# Patient Record
Sex: Female | Born: 1965 | Race: White | Hispanic: No | Marital: Married | Smoking: Never smoker
Health system: Southern US, Community
[De-identification: ages and names within clinical notes are randomized; demographics above are authoritative.]

## PROBLEM LIST (undated history)

## (undated) DIAGNOSIS — Z973 Presence of spectacles and contact lenses: Secondary | ICD-10-CM

## (undated) DIAGNOSIS — S83206A Unspecified tear of unspecified meniscus, current injury, right knee, initial encounter: Secondary | ICD-10-CM

## (undated) DIAGNOSIS — Z86018 Personal history of other benign neoplasm: Secondary | ICD-10-CM

## (undated) DIAGNOSIS — J309 Allergic rhinitis, unspecified: Secondary | ICD-10-CM

## (undated) DIAGNOSIS — M1990 Unspecified osteoarthritis, unspecified site: Secondary | ICD-10-CM

## (undated) DIAGNOSIS — M9420 Chondromalacia, unspecified site: Secondary | ICD-10-CM

## (undated) DIAGNOSIS — I8390 Asymptomatic varicose veins of unspecified lower extremity: Secondary | ICD-10-CM

## (undated) DIAGNOSIS — G43909 Migraine, unspecified, not intractable, without status migrainosus: Secondary | ICD-10-CM

---

## 2000-08-25 HISTORY — PX: VAGINAL HYSTERECTOMY: SUR661

## 2001-11-15 ENCOUNTER — Other Ambulatory Visit: Admission: RE | Admit: 2001-11-15 | Discharge: 2001-11-15 | Payer: Self-pay | Admitting: Obstetrics and Gynecology

## 2002-01-03 ENCOUNTER — Encounter: Payer: Self-pay | Admitting: Obstetrics and Gynecology

## 2002-01-03 ENCOUNTER — Ambulatory Visit (HOSPITAL_COMMUNITY): Admission: RE | Admit: 2002-01-03 | Discharge: 2002-01-03 | Payer: Self-pay | Admitting: Obstetrics and Gynecology

## 2002-01-24 ENCOUNTER — Ambulatory Visit (HOSPITAL_COMMUNITY): Admission: RE | Admit: 2002-01-24 | Discharge: 2002-01-24 | Payer: Self-pay | Admitting: Obstetrics and Gynecology

## 2002-07-09 ENCOUNTER — Inpatient Hospital Stay (HOSPITAL_COMMUNITY): Admission: AD | Admit: 2002-07-09 | Discharge: 2002-07-11 | Payer: Self-pay | Admitting: Obstetrics and Gynecology

## 2002-07-29 ENCOUNTER — Encounter: Admission: RE | Admit: 2002-07-29 | Discharge: 2002-07-29 | Payer: Self-pay | Admitting: Obstetrics and Gynecology

## 2002-07-29 ENCOUNTER — Encounter: Payer: Self-pay | Admitting: Obstetrics and Gynecology

## 2003-01-26 ENCOUNTER — Encounter: Admission: RE | Admit: 2003-01-26 | Discharge: 2003-01-26 | Payer: Self-pay | Admitting: Obstetrics and Gynecology

## 2003-01-26 ENCOUNTER — Encounter: Payer: Self-pay | Admitting: Obstetrics and Gynecology

## 2003-03-07 ENCOUNTER — Other Ambulatory Visit: Admission: RE | Admit: 2003-03-07 | Discharge: 2003-03-07 | Payer: Self-pay | Admitting: Obstetrics and Gynecology

## 2003-07-31 ENCOUNTER — Encounter: Admission: RE | Admit: 2003-07-31 | Discharge: 2003-07-31 | Payer: Self-pay | Admitting: General Surgery

## 2004-04-09 ENCOUNTER — Other Ambulatory Visit: Admission: RE | Admit: 2004-04-09 | Discharge: 2004-04-09 | Payer: Self-pay | Admitting: Obstetrics and Gynecology

## 2005-09-08 ENCOUNTER — Encounter: Admission: RE | Admit: 2005-09-08 | Discharge: 2005-09-08 | Payer: Self-pay | Admitting: Obstetrics and Gynecology

## 2006-02-17 ENCOUNTER — Other Ambulatory Visit: Admission: RE | Admit: 2006-02-17 | Discharge: 2006-02-17 | Payer: Self-pay | Admitting: Obstetrics and Gynecology

## 2006-08-25 LAB — HM MAMMOGRAPHY

## 2006-08-25 LAB — HM PAP SMEAR

## 2012-03-25 ENCOUNTER — Other Ambulatory Visit (HOSPITAL_BASED_OUTPATIENT_CLINIC_OR_DEPARTMENT_OTHER): Payer: Self-pay | Admitting: Family Medicine

## 2012-03-25 ENCOUNTER — Ambulatory Visit (HOSPITAL_BASED_OUTPATIENT_CLINIC_OR_DEPARTMENT_OTHER)
Admission: RE | Admit: 2012-03-25 | Discharge: 2012-03-25 | Disposition: A | Discharge status: Home / Self Care | Payer: 59 | Source: Ambulatory Visit | Attending: Family Medicine | Admitting: Family Medicine

## 2012-03-25 DIAGNOSIS — Z1231 Encounter for screening mammogram for malignant neoplasm of breast: Secondary | ICD-10-CM

## 2012-03-30 ENCOUNTER — Other Ambulatory Visit: Payer: Self-pay | Admitting: Family Medicine

## 2012-03-30 DIAGNOSIS — R928 Other abnormal and inconclusive findings on diagnostic imaging of breast: Secondary | ICD-10-CM

## 2012-04-12 ENCOUNTER — Ambulatory Visit
Admission: RE | Admit: 2012-04-12 | Discharge: 2012-04-12 | Disposition: A | Discharge status: Home / Self Care | Payer: 59 | Source: Ambulatory Visit | Attending: Family Medicine | Admitting: Family Medicine

## 2012-04-12 DIAGNOSIS — R928 Other abnormal and inconclusive findings on diagnostic imaging of breast: Secondary | ICD-10-CM

## 2012-12-09 ENCOUNTER — Encounter: Payer: Self-pay | Admitting: *Deleted

## 2012-12-09 DIAGNOSIS — E669 Obesity, unspecified: Secondary | ICD-10-CM

## 2012-12-09 DIAGNOSIS — Z889 Allergy status to unspecified drugs, medicaments and biological substances status: Secondary | ICD-10-CM

## 2013-01-04 ENCOUNTER — Encounter: Payer: Self-pay | Admitting: Family Medicine

## 2013-01-04 ENCOUNTER — Encounter: Payer: Self-pay | Admitting: *Deleted

## 2013-01-04 ENCOUNTER — Ambulatory Visit (INDEPENDENT_AMBULATORY_CARE_PROVIDER_SITE_OTHER): Payer: 59 | Admitting: Family Medicine

## 2013-01-04 VITALS — BP 130/85 | HR 75 | Ht 62.0 in | Wt 161.0 lb

## 2013-01-04 DIAGNOSIS — E669 Obesity, unspecified: Secondary | ICD-10-CM

## 2013-01-04 DIAGNOSIS — I83811 Varicose veins of right lower extremities with pain: Secondary | ICD-10-CM

## 2013-01-04 DIAGNOSIS — I83893 Varicose veins of bilateral lower extremities with other complications: Secondary | ICD-10-CM

## 2013-01-04 DIAGNOSIS — G43909 Migraine, unspecified, not intractable, without status migrainosus: Secondary | ICD-10-CM

## 2013-01-04 MED ORDER — PHENTERMINE HCL 37.5 MG PO TABS: 37.5000 mg | ORAL_TABLET | Freq: Every day | ORAL | Status: DC

## 2013-01-04 MED ORDER — CYCLOBENZAPRINE HCL 5 MG PO TABS: ORAL_TABLET | ORAL | Status: DC

## 2013-01-04 MED ORDER — RIZATRIPTAN BENZOATE 10 MG PO TBDP: 10.0000 mg | ORAL_TABLET | ORAL | Status: AC | PRN

## 2013-01-04 MED ORDER — DICLOFENAC SODIUM 1 % TD GEL: TRANSDERMAL | Status: DC

## 2013-01-04 MED ORDER — TOPIRAMATE ER 50 MG PO CAP24: 1.0000 | ORAL_CAPSULE | Freq: Every day | ORAL | Status: DC

## 2013-01-04 MED ORDER — PROMETHAZINE HCL 12.5 MG PO TABS: ORAL_TABLET | ORAL | Status: DC

## 2013-01-04 NOTE — Progress Notes (Signed)
  Subjective:    Patient ID: Lauren Wise, female    DOB: 11/30/1965, 47 y.o.   MRN: [id number]  HPI Lauren Wise is here today to discuss a few issues.  1)  Varicose Veins:  She has varicose veins and wants to know the best options for treatment.    2)  Migraine Headaches:  She has never taken medications specifically for migraine headaches.    3)  Weight:  She has maintained her weight loss.     Review of Systems  Constitutional: Negative for unexpected weight change.  Musculoskeletal:       Pain in legs R > L   Neurological: Positive for headaches.   Past Medical History  Diagnosis Date  . Allergy   . Obesity   . Wheezing without diagnosis of asthma   . Varicose veins of right lower extremity with pain    Family History  Problem Relation Age of Onset  . Diabetes Mother   . Heart disease Maternal Grandmother   . Heart disease Paternal Grandfather   . Stroke Paternal Grandfather    History   Social History Narrative   Marital Status: Married [employer])    Children:  Daughter Lupita Leash) Son Reuel Boom)    Pets: Dog (1)    Living Situation: Lives with her husband and daughter.   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 No distress.  HENT:  Head: Normocephalic.  Eyes: No scleral icterus.  Neck: No thyromegaly present.  Cardiovascular: Normal rate, regular rhythm and normal heart sounds.   Pulmonary/Chest: Effort normal and breath sounds normal.  Abdominal: There is no tenderness.  Musculoskeletal: She exhibits no edema and no tenderness.  Neurological: She is alert.  Skin: Skin is warm and dry.  Psychiatric: She has a normal mood and affect. Her behavior is normal. Judgment  and thought content normal.          Assessment & Plan:

## 2013-01-04 NOTE — Patient Instructions (Addendum)
1)  Varicose Veins - Apply the Voltaren Gel up to 4 times per day and consider wearing compression stockings/ support hose to help with the pain.  Consider making an appointment with a vein specialist to see what your options might be.    2)  Weight - Continue on the phentermine taking 1/2 pill per day.    3)  Migraine Headaches - Start on the Trokendi XR 25 mg then increase to 50 mg. We can increase to 100 mg if needed.  If you feel groggy/loopy you can try taking some L-Tyrosine to decrease those symptoms.     Varicose Veins Varicose veins are veins that have become enlarged and twisted. CAUSES This condition is the result of valves in the veins not working properly. Valves in the veins help return blood from the leg to the heart. If these valves are damaged, blood flows backwards and backs up into the veins in the leg near the skin. This causes the veins to become larger. People who are on their feet a lot, who are pregnant, or who are overweight are more likely to develop varicose veins. SYMPTOMS   Bulging, twisted-appearing, bluish veins, most commonly found on the legs.  Leg pain or a feeling of heaviness. These symptoms may be worse at the end of the day.  Leg swelling.  Skin color changes. DIAGNOSIS  Varicose veins can usually be diagnosed with an exam of your legs by your caregiver. He or she may recommend an ultrasound of your leg veins. TREATMENT  Most varicose veins can be treated at home.However, other treatments are available for people who have persistent symptoms or who want to treat the cosmetic appearance of the varicose veins. These include:  Laser treatment of very small varicose veins.  Medicine that is shot (injected) into the vein. This medicine hardens the walls of the vein and closes off the vein. This treatment is called sclerotherapy. Afterwards, you may need to wear clothing or bandages that apply pressure.  Surgery. HOME CARE INSTRUCTIONS   Do not stand  or sit in one position for long periods of time. Do not sit with your legs crossed. Rest with your legs raised during the day.  Wear elastic stockings or support hose. Do not wear other tight, encircling garments around the legs, pelvis, or waist.  Walk as much as possible to increase blood flow.  Raise the foot of your bed at night with 2-inch blocks.  If you get a cut in the skin over the vein and the vein bleeds, lie down with your leg raised and press on it with a clean cloth until the bleeding stops. Then place a bandage (dressing) on the cut. See your caregiver if it continues to bleed or needs stitches. SEEK MEDICAL CARE IF:   The skin around your ankle starts to break down.  You have pain, redness, tenderness, or hard swelling developing in your leg over a vein.  You are uncomfortable due to leg pain. Document Released: 05/21/2005 Document Revised: 11/03/2011 Document Reviewed: 10/07/2010 [facility] Patient Information 2013 [place], [place].  Varicose Vein Surgery Varicose veins are veins that have become enlarged and twisted. Small varicose veins are sometimes called "spider veins." Valves in the veins help move blood from the leg to the heart. If these valves are damaged, blood flows backwards. The blood backs up into the veins in the leg near the skin surface. The veins expand and get larger because of increased pressure against the inside walls of the  veins. People who are on their feet for long periods are at higher risk for this problem. It is also commonly seen during pregnancy or in those who are overweight. Varicose vein surgery is done to remove enlarged veins. This helps reduce pain, aching, and the risk of bleeding and blood clots that can form in these veins. Surgery can also improve the cosmetic appearance of the affected area. There are several surgical methods that can be used. Your caregiver will discuss the method that is best for you based on your specific needs.  Treatment usually does not mean a hospital stay or a long, uncomfortable recovery. Less invasive techniques most often allow varicose veins to be dealt with on an outpatient basis. LET YOUR CAREGIVER KNOW ABOUT:   Allergies.  Medications taken including herbs, eye drops, prescription medications, over the counter medications, and creams.  Use of steroids (by mouth or creams).  Use of aspirin or blood-thinning medicines.  History of bleeding or blood problems.  Previous problems with anesthetics or Novocaine.  Possibility of pregnancy, if this applies.  History of blood clots (thrombophlebitis).  Previous surgery.  Other health problems. RISKS AND COMPLICATIONS  Blood clots in deep veins (thrombophlebitis).  Infection.  Ulcer of the skin (breakdown of skin).  Recurrent varicose veins.  If receiving Sclerotherapy (see below), the following are uncommon but possible:  Temporary stinging or painful cramps where the injection was made.  Temporary red, raised patches of skin where the injection was made.  Temporary small skin sores where the injection was made.  Temporary bruises where the injection was made.  Spots around the treated vein that usually disappear.  Brown lines around the treated vein that usually disappear.  Groups of fine, red blood vessels around the treated vein that usually disappear.  The treated vein can also become inflamed or develop lumps of clotted blood. This is not dangerous. BEFORE THE PROCEDURE  If you are using any "blood thinner" medicines or medicines for arthritis, they may need to be stopped temporarily before the procedure. Make sure you discuss this with your caregiver before surgery. PROCEDURE Several types of surgery are available for safe and effective treatment of varicose veins:   Sclerotherapy. Your caregiver injects small and medium sized varicose veins with a solution that scars and closes those veins. In a few weeks,  treated varicose veins should fade. Some veins may need to be injected more than once. Sclerotherapy is effective if done correctly. Sclerotherapy does not require anesthesia and can be done in your doctor's office.  Laser surgeries. Doctors use lasers on smaller varicose veins. Laser surgery works by sending strong bursts of light onto the vein, which makes the vein slowly fade and disappear. No incisions or needles are used. The whole procedure usually takes less than 45 minutes.  Catheter-assisted procedures. This procedure is usually done for larger varicose veins. In one of these treatments, your caregiver inserts a thin tube (catheter) into an enlarged vein and heats the tip of the catheter. As the catheter is pulled out, the heat destroys the vein by causing it to collapse and seal shut.  Vein stripping. This involves removing a long vein through small incisions. This is an outpatient procedure for most, but not all people. Removing the vein does not affect circulation in your leg because veins deeper in the leg take care of the larger volumes of blood.  Ambulatory phlebectomy. Your doctor removes smaller varicose veins through a series of tiny skin punctures. Local anesthesia is  used in this outpatient procedure. Scarring is generally minimal.  Endoscopic vein surgery. You might need this operation only in a severe case involving leg ulcers. Your caregiver uses a thin video camera and inserts it into your leg to close varicose veins. The doctor then removes the veins through small incisions. AFTER THE PROCEDURE   You may be asked to wear a compression stocking for 3 to 5 days. This will insure that the treated vein(s) stays closed.  If you were taking any "blood thinner" medicines or medicines for arthritis, talk to your caregiver about when these can be restarted. HOME CARE INSTRUCTIONS   You may need to elevate the treated leg for part of the day for the first 2-3 days after  treatment  You may need to continue using compression stockings to lower the chance of developing recurrent varicose veins.  Restart regular prescription and non-prescription medicines as per your caregiver's instructions. SEEK MEDICAL CARE IF:   An oral temperature above 102 F (38.9 C) develops, not controlled by medication.  You notice increasing pain not adequately controlled with medicine prescribed.  You notice pus or oozing of fluid from any incision site 2 or more days after the surgery. SEEK IMMEDIATE MEDICAL CARE IF:   There is increased pain or swelling in the calf of your leg.  You notice red-streaking starting at an incision site and extending above or below that site.  An oral temperature above 102 F (38.9 C) develops, not controlled by medication.  You have sudden onset of chest pain, shortness of breath, difficulty breathing, or begin coughing up blood.  You develop unexplained abdominal pain.  You have increased bruising or develop sudden swelling in a joint. Document Released: 08/31/2007 Document Revised: 11/03/2011 Document Reviewed: 08/31/2007 [facility] Patient Information 2013 [place], [place].

## 2013-01-17 ENCOUNTER — Encounter: Payer: Self-pay | Admitting: Family Medicine

## 2013-01-17 DIAGNOSIS — I83819 Varicose veins of unspecified lower extremities with pain: Secondary | ICD-10-CM | POA: Insufficient documentation

## 2013-01-17 DIAGNOSIS — G43909 Migraine, unspecified, not intractable, without status migrainosus: Secondary | ICD-10-CM | POA: Insufficient documentation

## 2013-01-17 NOTE — Assessment & Plan Note (Signed)
We discussed Qsymia but she will try a combination of phentermine and Topamax.

## 2013-01-17 NOTE — Assessment & Plan Note (Addendum)
She was given medications to help with her migraine headaches including an extended release form of Topamax (Trokendi XR), Maxalt, Phenergan and Flexeril.

## 2013-01-17 NOTE — Assessment & Plan Note (Signed)
Lauren Wise will follow up with [facility].

## 2013-03-01 ENCOUNTER — Ambulatory Visit: Payer: 59 | Admitting: Family Medicine

## 2013-03-14 ENCOUNTER — Telehealth: Payer: Self-pay | Admitting: *Deleted

## 2013-03-14 NOTE — Telephone Encounter (Signed)
Lauren Wise has an appt. In 03/2013. Can you refill her topiramate for 30 days?  I have not verified this med. She would like it to go to [facility] on [address]. [place]

## 2013-04-12 ENCOUNTER — Ambulatory Visit (INDEPENDENT_AMBULATORY_CARE_PROVIDER_SITE_OTHER): Payer: 59 | Admitting: Family Medicine

## 2013-04-12 ENCOUNTER — Encounter: Payer: Self-pay | Admitting: Family Medicine

## 2013-04-12 ENCOUNTER — Other Ambulatory Visit: Payer: Self-pay

## 2013-04-12 VITALS — BP 108/74 | HR 73 | Resp 16 | Wt 164.0 lb

## 2013-04-12 DIAGNOSIS — G43909 Migraine, unspecified, not intractable, without status migrainosus: Secondary | ICD-10-CM

## 2013-04-12 DIAGNOSIS — Z1231 Encounter for screening mammogram for malignant neoplasm of breast: Secondary | ICD-10-CM

## 2013-04-12 DIAGNOSIS — J309 Allergic rhinitis, unspecified: Secondary | ICD-10-CM

## 2013-04-12 MED ORDER — AZELASTINE HCL 0.05 % OP SOLN: 1.0000 [drp] | Freq: Two times a day (BID) | OPHTHALMIC | Status: AC

## 2013-04-12 MED ORDER — AZELASTINE HCL 0.1 % NA SOLN: 2.0000 | Freq: Two times a day (BID) | NASAL | Status: DC

## 2013-04-12 MED ORDER — MONTELUKAST SODIUM 10 MG PO TABS: 10.0000 mg | ORAL_TABLET | Freq: Every day | ORAL | Status: DC

## 2013-04-12 MED ORDER — AZELASTINE HCL 0.05 % OP SOLN: 1.0000 [drp] | Freq: Two times a day (BID) | OPHTHALMIC | Status: DC

## 2013-04-12 MED ORDER — TOPIRAMATE ER 100 MG PO CAP24: 100.0000 mg | ORAL_CAPSULE | Freq: Every day | ORAL | Status: DC

## 2013-04-12 NOTE — Patient Instructions (Addendum)
1)  Migraine Headaches -   Trokendi XR 100 mg  Migrelief  Chiropractic Adjustment and/or acupuncture   Dr. Neale Burly ([facility]) Botox   2)  Allergies  Zyrtec at night  Allegra D in morning Norel AD   Singulair - Stuffy   Astelin - Nasal sray you can do with Nasonex.  It is a nasal antihistamine spray.    Eye - Zaditor vs Optivar vs Patanol.     Recurrent Migraine Headache A migraine headache is an intense, throbbing pain on one or both sides of your head. Recurrent migraines keep coming back. A migraine can last for 30 minutes to several hours. CAUSES  The exact cause of a migraine headache is not always known. However, a migraine may be caused when nerves in the brain become irritated and release chemicals that cause inflammation. This causes pain.  SYMPTOMS   Pain on one or both sides of your head.  Pulsating or throbbing pain.  Severe pain that prevents daily activities.  Pain that is aggravated by any physical activity.  Nausea, vomiting, or both.  Dizziness.  Pain with exposure to bright lights, loud noises, or activity.  General sensitivity to bright lights, loud noises, or smells. Before you get a migraine, you may get warning signs that a migraine is coming (aura). An aura may include:  Seeing flashing lights.  Seeing bright spots, halos, or zig-zag lines.  Having tunnel vision or blurred vision.  Having feelings of numbness or tingling.  Having trouble talking.  Having muscle weakness. MIGRAINE TRIGGERS Examples of triggers of migraine headaches include:   Alcohol.  Smoking.  Stress.  Menstruation.  Aged cheeses.  Foods or drinks that contain nitrates, glutamate, aspartame, or tyramine.  Lack of sleep.  Chocolate.  Caffeine.  Hunger.  Physical exertion.  Fatigue.  Medicines used to treat chest pain (nitroglycerine), birth control pills, estrogen, and some blood pressure medicines. DIAGNOSIS  A recurrent  migraine headache is often diagnosed based on:  Symptoms.  Physical examination.  A CT scan or MRI of your head. TREATMENT  Medicines may be given for pain and nausea. Medicines can also be given to help prevent recurrent migraines. HOME CARE INSTRUCTIONS  Only take over-the-counter or prescription medicines for pain or discomfort as directed by your caregiver. The use of long-term narcotics is not recommended.  Lie down in a dark, quiet room when you have a migraine.  Keep a journal to find out what may trigger your migraine headaches. For example, write down:  What you eat and drink.  How much sleep you get.  Any change to your diet or medicines.  Limit alcohol consumption.  Quit smoking if you smoke.  Get 7 to 9 hours of sleep, or as recommended by your caregiver.  Limit stress.  Keep lights dim if bright lights bother you and make your migraines worse. SEEK MEDICAL CARE IF:   You do not get relief from the medicines given to you.  You have a recurrence of pain. SEEK IMMEDIATE MEDICAL CARE IF:  Your migraine becomes severe.  You have a fever.  You have a stiff neck.  You have loss of vision.  You have muscular weakness or loss of muscle control.  You start losing your balance or have trouble walking.  You feel faint or pass out.  You have severe symptoms that are different from your first symptoms. MAKE SURE YOU:   Understand these instructions.  Will watch your condition.  Will get help right away  if you are not doing well or get worse. Document Released: 05/06/2001 Document Revised: 11/03/2011 Document Reviewed: 08/01/2011 [facility] Patient Information 2014 [place], [place]. Recurrent Migraine Headache A migraine headache is an intense, throbbing pain on one or both sides of your head. Recurrent migraines keep coming back. A migraine can last for 30 minutes to several hours. CAUSES  The exact cause of a migraine headache is not always known.  However, a migraine may be caused when nerves in the brain become irritated and release chemicals that cause inflammation. This causes pain.  SYMPTOMS   Pain on one or both sides of your head.  Pulsating or throbbing pain.  Severe pain that prevents daily activities.  Pain that is aggravated by any physical activity.  Nausea, vomiting, or both.  Dizziness.  Pain with exposure to bright lights, loud noises, or activity.  General sensitivity to bright lights, loud noises, or smells. Before you get a migraine, you may get warning signs that a migraine is coming (aura). An aura may include:  Seeing flashing lights.  Seeing bright spots, halos, or zig-zag lines.  Having tunnel vision or blurred vision.  Having feelings of numbness or tingling.  Having trouble talking.  Having muscle weakness. MIGRAINE TRIGGERS Examples of triggers of migraine headaches include:   Alcohol.  Smoking.  Stress.  Menstruation.  Aged cheeses.  Foods or drinks that contain nitrates, glutamate, aspartame, or tyramine.  Lack of sleep.  Chocolate.  Caffeine.  Hunger.  Physical exertion.  Fatigue.  Medicines used to treat chest pain (nitroglycerine), birth control pills, estrogen, and some blood pressure medicines. DIAGNOSIS  A recurrent migraine headache is often diagnosed based on:  Symptoms.  Physical examination.  A CT scan or MRI of your head. TREATMENT  Medicines may be given for pain and nausea. Medicines can also be given to help prevent recurrent migraines. HOME CARE INSTRUCTIONS  Only take over-the-counter or prescription medicines for pain or discomfort as directed by your caregiver. The use of long-term narcotics is not recommended.  Lie down in a dark, quiet room when you have a migraine.  Keep a journal to find out what may trigger your migraine headaches. For example, write down:  What you eat and drink.  How much sleep you get.  Any change to your diet  or medicines.  Limit alcohol consumption.  Quit smoking if you smoke.  Get 7 to 9 hours of sleep, or as recommended by your caregiver.  Limit stress.  Keep lights dim if bright lights bother you and make your migraines worse. SEEK MEDICAL CARE IF:   You do not get relief from the medicines given to you.  You have a recurrence of pain. SEEK IMMEDIATE MEDICAL CARE IF:  Your migraine becomes severe.  You have a fever.  You have a stiff neck.  You have loss of vision.  You have muscular weakness or loss of muscle control.  You start losing your balance or have trouble walking.  You feel faint or pass out.  You have severe symptoms that are different from your first symptoms. MAKE SURE YOU:   Understand these instructions.  Will watch your condition.  Will get help right away if you are not doing well or get worse. Document Released: 05/06/2001 Document Revised: 11/03/2011 Document Reviewed: 08/01/2011 [facility] Patient Information 2014 [place], [place].

## 2013-04-12 NOTE — Progress Notes (Signed)
  Subjective:    Patient ID: Lauren Wise, female    DOB: 03-27-1966, 47 y.o.   MRN: [id number]  Lauren Wise is here today to discuss her migraine headaches.  She has been taking Trokendi XR and Maxalt.  She feels that this combination is not helping her very much.  She has been getting more headaches this summer.   Migraine  This is a recurrent problem. The problem occurs constantly. The problem has been gradually worsening. The pain is located in the frontal and right unilateral region. The pain quality is similar to prior headaches. The quality of the pain is described as sharp, aching and pulsating. The pain is at a severity of 5/10. The pain is moderate. Associated symptoms include eye redness, phonophobia, photophobia and a visual change.    Review of Systems  Eyes: Positive for photophobia and redness.  Neurological: Positive for headaches.    Past Medical History  Diagnosis Date  . Allergy   . Obesity   . Wheezing without diagnosis of asthma   . Varicose veins of right lower extremity with pain     Family History  Problem Relation Age of Onset  . Diabetes Mother   . Heart disease Maternal Grandmother   . Heart disease Paternal Grandfather   . Stroke Paternal Grandfather     History   Social History Narrative   Marital Status: Married [employer])    Children:  Daughter Lupita Leash) Son Reuel Boom)    Pets: Dog (1)    Living Situation: Lives with her husband and daughter.   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 Constitutional: She is oriented to person, place, and time. She appears well-developed and well-nourished. No distress.  HENT:  Head: Normocephalic and atraumatic.  Mouth/Throat: Oropharynx is  clear and moist.  Eyes: Conjunctivae and EOM are normal. Pupils are equal, round, and reactive to light. Right eye exhibits no discharge. Left eye exhibits no discharge. No scleral icterus.  Neck: Normal range of motion. Neck supple. No thyromegaly present.  Cardiovascular: Normal rate, regular rhythm and normal heart sounds.   Pulmonary/Chest: Effort normal and breath sounds normal. No respiratory distress.  Abdominal: There is no tenderness.  Musculoskeletal: She exhibits no edema and no tenderness.  Lymphadenopathy:    She has no cervical adenopathy.  Neurological: She is alert and oriented to person, place, and time.  Skin: Skin is warm and dry.  Psychiatric: She has a normal mood and affect.          Assessment & Plan:

## 2013-05-02 ENCOUNTER — Ambulatory Visit
Admission: RE | Admit: 2013-05-02 | Discharge: 2013-05-02 | Disposition: A | Discharge status: Home / Self Care | Payer: 59 | Source: Ambulatory Visit

## 2013-05-02 DIAGNOSIS — Z1231 Encounter for screening mammogram for malignant neoplasm of breast: Secondary | ICD-10-CM

## 2013-05-27 ENCOUNTER — Encounter: Payer: Self-pay | Admitting: Family Medicine

## 2013-05-31 ENCOUNTER — Other Ambulatory Visit: Payer: 59 | Admitting: Family Medicine

## 2013-06-05 DIAGNOSIS — J309 Allergic rhinitis, unspecified: Secondary | ICD-10-CM | POA: Insufficient documentation

## 2013-06-05 NOTE — Assessment & Plan Note (Signed)
She was given prescriptions for allergy medications.

## 2013-06-05 NOTE — Assessment & Plan Note (Signed)
She was given meds for her headaches.

## 2013-06-30 ENCOUNTER — Other Ambulatory Visit: Payer: 59 | Admitting: Family Medicine

## 2013-07-05 ENCOUNTER — Ambulatory Visit (INDEPENDENT_AMBULATORY_CARE_PROVIDER_SITE_OTHER): Payer: 59 | Admitting: Family Medicine

## 2013-07-05 ENCOUNTER — Encounter: Payer: Self-pay | Admitting: Family Medicine

## 2013-07-05 VITALS — BP 107/73 | HR 66 | Resp 16 | Ht 61.5 in | Wt 168.0 lb

## 2013-07-05 DIAGNOSIS — Z23 Encounter for immunization: Secondary | ICD-10-CM

## 2013-07-05 DIAGNOSIS — E663 Overweight: Secondary | ICD-10-CM

## 2013-07-05 DIAGNOSIS — G43909 Migraine, unspecified, not intractable, without status migrainosus: Secondary | ICD-10-CM

## 2013-07-05 DIAGNOSIS — Z0000 Encounter for general adult medical examination without abnormal findings: Secondary | ICD-10-CM

## 2013-07-05 LAB — POCT URINALYSIS DIPSTICK
Bilirubin, UA: NEGATIVE
Blood, UA: NEGATIVE
Glucose, UA: NEGATIVE
Ketones, UA: NEGATIVE
Leukocytes, UA: NEGATIVE
Nitrite, UA: NEGATIVE
Protein, UA: NEGATIVE
Spec Grav, UA: 1.02
Urobilinogen, UA: NEGATIVE
pH, UA: 6.5

## 2013-07-05 MED ORDER — TOPIRAMATE ER 100 MG PO CAP24: 100.0000 mg | ORAL_CAPSULE | Freq: Every day | ORAL | Status: DC

## 2013-07-05 MED ORDER — PHENTERMINE HCL 37.5 MG PO TABS: 37.5000 mg | ORAL_TABLET | Freq: Every day | ORAL | Status: AC

## 2013-07-05 NOTE — Patient Instructions (Addendum)
1) Preventative Issues - Complete the stool cards and return.    2)  Knee Pain - We are referring you to Dr. Charlann Boxer.    Preventive Care for Adults, Female A healthy lifestyle and preventive care can promote health and wellness. Preventive health guidelines for women include the following key practices.  A routine yearly physical is a good way to check with your caregiver about your health and preventive screening. It is a chance to share any concerns and updates on your health, and to receive a thorough exam.  Visit your dentist for a routine exam and preventive care every 6 months. Brush your teeth twice a day and floss once a day. Good oral hygiene prevents tooth decay and gum disease.  The frequency of eye exams is based on your age, health, family medical history, use of contact lenses, and other factors. Follow your caregiver's recommendations for frequency of eye exams.  Eat a healthy diet. Foods like vegetables, fruits, whole grains, low-fat dairy products, and lean protein foods contain the nutrients you need without too many calories. Decrease your intake of foods high in solid fats, added sugars, and salt. Eat the right amount of calories for you.Get information about a proper diet from your caregiver, if necessary.  Regular physical exercise is one of the most important things you can do for your health. Most adults should get at least 150 minutes of moderate-intensity exercise (any activity that increases your heart rate and causes you to sweat) each week. In addition, most adults need muscle-strengthening exercises on 2 or more days a week.  Maintain a healthy weight. The body mass index (BMI) is a screening tool to identify possible weight problems. It provides an estimate of body fat based on height and weight. Your caregiver can help determine your BMI, and can help you achieve or maintain a healthy weight.For adults 20 years and older:  A BMI below 18.5 is considered  underweight.  A BMI of 18.5 to 24.9 is normal.  A BMI of 25 to 29.9 is considered overweight.  A BMI of 30 and above is considered obese.  Maintain normal blood lipids and cholesterol levels by exercising and minimizing your intake of saturated fat. Eat a balanced diet with plenty of fruit and vegetables. Blood tests for lipids and cholesterol should begin at age 68 and be repeated every 5 years. If your lipid or cholesterol levels are high, you are over 50, or you are at high risk for heart disease, you may need your cholesterol levels checked more frequently.Ongoing high lipid and cholesterol levels should be treated with medicines if diet and exercise are not effective.  If you smoke, find out from your caregiver how to quit. If you do not use tobacco, do not start.  Lung cancer screening is recommended for adults aged 56 80 years who are at high risk for developing lung cancer because of a history of smoking. Yearly low-dose computed tomography (CT) is recommended for people who have at least a 30-pack-year history of smoking and are a current smoker or have quit within the past 15 years. A pack year of smoking is smoking an average of 1 pack of cigarettes a day for 1 year (for example: 1 pack a day for 30 years or 2 packs a day for 15 years). Yearly screening should continue until the smoker has stopped smoking for at least 15 years. Yearly screening should also be stopped for people who develop a health problem that would prevent  them from having lung cancer treatment.  If you are pregnant, do not drink alcohol. If you are breastfeeding, be very cautious about drinking alcohol. If you are not pregnant and choose to drink alcohol, do not exceed 1 drink per day. One drink is considered to be 12 ounces (355 mL) of beer, 5 ounces (148 mL) of wine, or 1.5 ounces (44 mL) of liquor.  Avoid use of street drugs. Do not share needles with anyone. Ask for help if you need support or instructions about  stopping the use of drugs.  High blood pressure causes heart disease and increases the risk of stroke. Your blood pressure should be checked at least every 1 to 2 years. Ongoing high blood pressure should be treated with medicines if weight loss and exercise are not effective.  If you are 64 to 48 years old, ask your caregiver if you should take aspirin to prevent strokes.  Diabetes screening involves taking a blood sample to check your fasting blood sugar level. This should be done once every 3 years, after age 42, if you are within normal weight and without risk factors for diabetes. Testing should be considered at a younger age or be carried out more frequently if you are overweight and have at least 1 risk factor for diabetes.  Breast cancer screening is essential preventive care for women. You should practice "breast self-awareness." This means understanding the normal appearance and feel of your breasts and may include breast self-examination. Any changes detected, no matter how small, should be reported to a caregiver. Women in their 105s and 30s should have a clinical breast exam (CBE) by a caregiver as part of a regular health exam every 1 to 3 years. After age 69, women should have a CBE every year. Starting at age 62, women should consider having a mammography (breast X-ray test) every year. Women who have a family history of breast cancer should talk to their caregiver about genetic screening. Women at a high risk of breast cancer should talk to their caregivers about having magnetic resonance imaging (MRI) and a mammography every year.  Breast cancer gene (BRCA)-related cancer risk assessment is recommended for women who have family members with BRCA-related cancers. BRCA-related cancers include breast, ovarian, tubal, and peritoneal cancers. Having family members with these cancers may be associated with an increased risk for harmful changes (mutations) in the breast cancer genes BRCA1 and  BRCA2. Results of the assessment will determine the need for genetic counseling and BRCA1 and BRCA2 testing.  The Pap test is a screening test for cervical cancer. A Pap test can show cell changes on the cervix that might become cervical cancer if left untreated. A Pap test is a procedure in which cells are obtained and examined from the lower end of the uterus (cervix).  Women should have a Pap test starting at age 60.  Between ages 82 and 50, Pap tests should be repeated every 2 years.  Beginning at age 41, you should have a Pap test every 3 years as long as the past 3 Pap tests have been normal.  Some women have medical problems that increase the chance of getting cervical cancer. Talk to your caregiver about these problems. It is especially important to talk to your caregiver if a new problem develops soon after your last Pap test. In these cases, your caregiver may recommend more frequent screening and Pap tests.  The above recommendations are the same for women who have or have not gotten  the vaccine for human papillomavirus (HPV).  If you had a hysterectomy for a problem that was not cancer or a condition that could lead to cancer, then you no longer need Pap tests. Even if you no longer need a Pap test, a regular exam is a good idea to make sure no other problems are starting.  If you are between ages 2 and 70, and you have had normal Pap tests going back 10 years, you no longer need Pap tests. Even if you no longer need a Pap test, a regular exam is a good idea to make sure no other problems are starting.  If you have had past treatment for cervical cancer or a condition that could lead to cancer, you need Pap tests and screening for cancer for at least 20 years after your treatment.  If Pap tests have been discontinued, risk factors (such as a new sexual partner) need to be reassessed to determine if screening should be resumed.  The HPV test is an additional test that may be used  for cervical cancer screening. The HPV test looks for the virus that can cause the cell changes on the cervix. The cells collected during the Pap test can be tested for HPV. The HPV test could be used to screen women aged 4 years and older, and should be used in women of any age who have unclear Pap test results. After the age of 49, women should have HPV testing at the same frequency as a Pap test.  Colorectal cancer can be detected and often prevented. Most routine colorectal cancer screening begins at the age of 81 and continues through age 49. However, your caregiver may recommend screening at an earlier age if you have risk factors for colon cancer. On a yearly basis, your caregiver may provide home test kits to check for hidden blood in the stool. Use of a small camera at the end of a tube, to directly examine the colon (sigmoidoscopy or colonoscopy), can detect the earliest forms of colorectal cancer. Talk to your caregiver about this at age 50, when routine screening begins. Direct examination of the colon should be repeated every 5 to 10 years through age 1, unless early forms of pre-cancerous polyps or small growths are found.  Hepatitis C blood testing is recommended for all people born from [birth year] through 1966 and any individual with known risks for hepatitis C.  Practice safe sex. Use condoms and avoid high-risk sexual practices to reduce the spread of sexually transmitted infections (STIs). STIs include gonorrhea, chlamydia, syphilis, trichomonas, herpes, HPV, and human immunodeficiency virus (HIV). Herpes, HIV, and HPV are viral illnesses that have no cure. They can result in disability, cancer, and death. Sexually active women aged 90 and younger should be checked for chlamydia. Older women with new or multiple partners should also be tested for chlamydia. Testing for other STIs is recommended if you are sexually active and at increased risk.  Osteoporosis is a disease in which the bones  lose minerals and strength with aging. This can result in serious bone fractures. The risk of osteoporosis can be identified using a bone density scan. Women ages 18 and over and women at risk for fractures or osteoporosis should discuss screening with their caregivers. Ask your caregiver whether you should take a calcium supplement or vitamin D to reduce the rate of osteoporosis.  Menopause can be associated with physical symptoms and risks. Hormone replacement therapy is available to decrease symptoms and risks. You should  talk to your caregiver about whether hormone replacement therapy is right for you.  Use sunscreen. Apply sunscreen liberally and repeatedly throughout the day. You should seek shade when your shadow is shorter than you. Protect yourself by wearing long sleeves, pants, a wide-brimmed hat, and sunglasses year round, whenever you are outdoors.  Once a month, do a whole body skin exam, using a mirror to look at the skin on your back. Notify your caregiver of new moles, moles that have irregular borders, moles that are larger than a pencil eraser, or moles that have changed in shape or color.  Stay current with required immunizations.  Influenza vaccine. All adults should be immunized every year.  Tetanus, diphtheria, and acellular pertussis (Td, Tdap) vaccine. Pregnant women should receive 1 dose of Tdap vaccine during each pregnancy. The dose should be obtained regardless of the length of time since the last dose. Immunization is preferred during the 27th to 36th week of gestation. An adult who has not previously received Tdap or who does not know her vaccine status should receive 1 dose of Tdap. This initial dose should be followed by tetanus and diphtheria toxoids (Td) booster doses every 10 years. Adults with an unknown or incomplete history of completing a 3-dose immunization series with Td-containing vaccines should begin or complete a primary immunization series including a Tdap  dose. Adults should receive a Td booster every 10 years.  Varicella vaccine. An adult without evidence of immunity to varicella should receive 2 doses or a second dose if she has previously received 1 dose. Pregnant females who do not have evidence of immunity should receive the first dose after pregnancy. This first dose should be obtained before leaving the health care facility. The second dose should be obtained 4 8 weeks after the first dose.  Human papillomavirus (HPV) vaccine. Females aged 67 26 years who have not received the vaccine previously should obtain the 3-dose series. The vaccine is not recommended for use in pregnant females. However, pregnancy testing is not needed before receiving a dose. If a female is found to be pregnant after receiving a dose, no treatment is needed. In that case, the remaining doses should be delayed until after the pregnancy. Immunization is recommended for any person with an immunocompromised condition through the age of 26 years if she did not get any or all doses earlier. During the 3-dose series, the second dose should be obtained 4 8 weeks after the first dose. The third dose should be obtained 24 weeks after the first dose and 16 weeks after the second dose.  Zoster vaccine. One dose is recommended for adults aged 52 years or older unless certain conditions are present.  Measles, mumps, and rubella (MMR) vaccine. Adults born before 50 generally are considered immune to measles and mumps. Adults born in [birth year] or later should have 1 or more doses of MMR vaccine unless there is a contraindication to the vaccine or there is laboratory evidence of immunity to each of the three diseases. A routine second dose of MMR vaccine should be obtained at least 28 days after the first dose for students attending postsecondary schools, health care workers, or international travelers. People who received inactivated measles vaccine or an unknown type of measles vaccine during  1963 1967 should receive 2 doses of MMR vaccine. People who received inactivated mumps vaccine or an unknown type of mumps vaccine before 1979 and are at high risk for mumps infection should consider immunization with 2 doses of MMR  vaccine. For females of childbearing age, rubella immunity should be determined. If there is no evidence of immunity, females who are not pregnant should be vaccinated. If there is no evidence of immunity, females who are pregnant should delay immunization until after pregnancy. Unvaccinated health care workers born before 52 who lack laboratory evidence of measles, mumps, or rubella immunity or laboratory confirmation of disease should consider measles and mumps immunization with 2 doses of MMR vaccine or rubella immunization with 1 dose of MMR vaccine.  Pneumococcal 13-valent conjugate (PCV13) vaccine. When indicated, a person who is uncertain of her immunization history and has no record of immunization should receive the PCV13 vaccine. An adult aged 47 years or older who has certain medical conditions and has not been previously immunized should receive 1 dose of PCV13 vaccine. This PCV13 should be followed with a dose of pneumococcal polysaccharide (PPSV23) vaccine. The PPSV23 vaccine dose should be obtained at least 8 weeks after the dose of PCV13 vaccine. An adult aged 56 years or older who has certain medical conditions and previously received 1 or more doses of PPSV23 vaccine should receive 1 dose of PCV13. The PCV13 vaccine dose should be obtained 1 or more years after the last PPSV23 vaccine dose.  Pneumococcal polysaccharide (PPSV23) vaccine. When PCV13 is also indicated, PCV13 should be obtained first. All adults aged 44 years and older should be immunized. An adult younger than age 88 years who has certain medical conditions should be immunized. Any person who resides in a nursing home or long-term care facility should be immunized. An adult smoker should be  immunized. People with an immunocompromised condition and certain other conditions should receive both PCV13 and PPSV23 vaccines. People with human immunodeficiency virus (HIV) infection should be immunized as soon as possible after diagnosis. Immunization during chemotherapy or radiation therapy should be avoided. Routine use of PPSV23 vaccine is not recommended for American Indians, [address], or people younger than 65 years unless there are medical conditions that require PPSV23 vaccine. When indicated, people who have unknown immunization and have no record of immunization should receive PPSV23 vaccine. One-time revaccination 5 years after the first dose of PPSV23 is recommended for people aged 64 64 years who have chronic kidney failure, nephrotic syndrome, asplenia, or immunocompromised conditions. People who received 1 2 doses of PPSV23 before age 63 years should receive another dose of PPSV23 vaccine at age 23 years or later if at least 5 years have passed since the previous dose. Doses of PPSV23 are not needed for people immunized with PPSV23 at or after age 30 years.  Meningococcal vaccine. Adults with asplenia or persistent complement component deficiencies should receive 2 doses of quadrivalent meningococcal conjugate (MenACWY-D) vaccine. The doses should be obtained at least 2 months apart. Microbiologists working with certain meningococcal bacteria, military recruits, people at risk during an outbreak, and people who travel to or live in countries with a high rate of meningitis should be immunized. A first-year college student up through age 60 years who is living in a residence hall should receive a dose if she did not receive a dose on or after her 16th birthday. Adults who have certain high-risk conditions should receive one or more doses of vaccine.  Hepatitis A vaccine. Adults who wish to be protected from this disease, have certain high-risk conditions, work with hepatitis A-infected  animals, work in hepatitis A research labs, or travel to or work in countries with a high rate of hepatitis A should be immunized.  Adults who were previously unvaccinated and who anticipate close contact with an international adoptee during the first 60 days after arrival in the [place] from a country with a high rate of hepatitis A should be immunized.  Hepatitis B vaccine. Adults who wish to be protected from this disease, have certain high-risk conditions, may be exposed to blood or other infectious body fluids, are household contacts or sex partners of hepatitis B positive people, are clients or workers in certain care facilities, or travel to or work in countries with a high rate of hepatitis B should be immunized.  Haemophilus influenzae type b (Hib) vaccine. A previously unvaccinated person with asplenia or sickle cell disease or having a scheduled splenectomy should receive 1 dose of Hib vaccine. Regardless of previous immunization, a recipient of a hematopoietic stem cell transplant should receive a 3-dose series 6 12 months after her successful transplant. Hib vaccine is not recommended for adults with HIV infection. Preventive Services / Frequency Ages 75 to 61  Blood pressure check.** / Every 1 to 2 years.  Lipid and cholesterol check.** / Every 5 years beginning at age 59.  Clinical breast exam.** / Every 3 years for women in their 21s and 30s.  BRCA-related cancer risk assessment.** / For women who have family members with a BRCA-related cancer (breast, ovarian, tubal, or peritoneal cancers).  Pap test.** / Every 2 years from ages 48 through 53. Every 3 years starting at age 70 through age 44 or 30 with a history of 3 consecutive normal Pap tests.  HPV screening.** / Every 3 years from ages 39 through ages 64 to 33 with a history of 3 consecutive normal Pap tests.  Hepatitis C blood test.** / For any individual with known risks for hepatitis C.  Skin self-exam. /  Monthly.  Influenza vaccine. / Every year.  Tetanus, diphtheria, and acellular pertussis (Tdap, Td) vaccine.** / Consult your caregiver. Pregnant women should receive 1 dose of Tdap vaccine during each pregnancy. 1 dose of Td every 10 years.  Varicella vaccine.** / Consult your caregiver. Pregnant females who do not have evidence of immunity should receive the first dose after pregnancy.  HPV vaccine. / 3 doses over 6 months, if 26 and younger. The vaccine is not recommended for use in pregnant females. However, pregnancy testing is not needed before receiving a dose.  Measles, mumps, rubella (MMR) vaccine.** / You need at least 1 dose of MMR if you were born in 1957 or later. You may also need a 2nd dose. For females of childbearing age, rubella immunity should be determined. If there is no evidence of immunity, females who are not pregnant should be vaccinated. If there is no evidence of immunity, females who are pregnant should delay immunization until after pregnancy.  Pneumococcal 13-valent conjugate (PCV13) vaccine.** / Consult your caregiver.  Pneumococcal polysaccharide (PPSV23) vaccine.** / 1 to 2 doses if you smoke cigarettes or if you have certain conditions.  Meningococcal vaccine.** / 1 dose if you are age 20 to 64 years and a Orthoptist living in a residence hall, or have one of several medical conditions, you need to get vaccinated against meningococcal disease. You may also need additional booster doses.  Hepatitis A vaccine.** / Consult your caregiver.  Hepatitis B vaccine.** / Consult your caregiver.  Haemophilus influenzae type b (Hib) vaccine.** / Consult your caregiver. Ages 59 to 28  Blood pressure check.** / Every 1 to 2 years.  Lipid and cholesterol check.** / Every  5 years beginning at age 45.  Lung cancer screening. / Every year if you are aged 32 80 years and have a 30-pack-year history of smoking and currently smoke or have quit within the past  15 years. Yearly screening is stopped once you have quit smoking for at least 15 years or develop a health problem that would prevent you from having lung cancer treatment.  Clinical breast exam.** / Every year after age 53.  BRCA-related cancer risk assessment.** / For women who have family members with a BRCA-related cancer (breast, ovarian, tubal, or peritoneal cancers).  Mammogram.** / Every year beginning at age 52 and continuing for as long as you are in good health. Consult with your caregiver.  Pap test.** / Every 3 years starting at age 71 through age 37 or 57 with a history of 3 consecutive normal Pap tests.  HPV screening.** / Every 3 years from ages 93 through ages 6 to 34 with a history of 3 consecutive normal Pap tests.  Fecal occult blood test (FOBT) of stool. / Every year beginning at age 24 and continuing until age 79. You may not need to do this test if you get a colonoscopy every 10 years.  Flexible sigmoidoscopy or colonoscopy.** / Every 5 years for a flexible sigmoidoscopy or every 10 years for a colonoscopy beginning at age 81 and continuing until age 50.  Hepatitis C blood test.** / For all people born from [birth year] through 1966 and any individual with known risks for hepatitis C.  Skin self-exam. / Monthly.  Influenza vaccine. / Every year.  Tetanus, diphtheria, and acellular pertussis (Tdap/Td) vaccine.** / Consult your caregiver. Pregnant women should receive 1 dose of Tdap vaccine during each pregnancy. 1 dose of Td every 10 years.  Varicella vaccine.** / Consult your caregiver. Pregnant females who do not have evidence of immunity should receive the first dose after pregnancy.  Zoster vaccine.** / 1 dose for adults aged 51 years or older.  Measles, mumps, rubella (MMR) vaccine.** / You need at least 1 dose of MMR if you were born in 1957 or later. You may also need a 2nd dose. For females of childbearing age, rubella immunity should be determined. If there is no  evidence of immunity, females who are not pregnant should be vaccinated. If there is no evidence of immunity, females who are pregnant should delay immunization until after pregnancy.  Pneumococcal 13-valent conjugate (PCV13) vaccine.** / Consult your caregiver.  Pneumococcal polysaccharide (PPSV23) vaccine.** / 1 to 2 doses if you smoke cigarettes or if you have certain conditions.  Meningococcal vaccine.** / Consult your caregiver.  Hepatitis A vaccine.** / Consult your caregiver.  Hepatitis B vaccine.** / Consult your caregiver.  Haemophilus influenzae type b (Hib) vaccine.** / Consult your caregiver. Ages 44 and over  Blood pressure check.** / Every 1 to 2 years.  Lipid and cholesterol check.** / Every 5 years beginning at age 2.  Lung cancer screening. / Every year if you are aged 50 80 years and have a 30-pack-year history of smoking and currently smoke or have quit within the past 15 years. Yearly screening is stopped once you have quit smoking for at least 15 years or develop a health problem that would prevent you from having lung cancer treatment.  Clinical breast exam.** / Every year after age 35.  BRCA-related cancer risk assessment.** / For women who have family members with a BRCA-related cancer (breast, ovarian, tubal, or peritoneal cancers).  Mammogram.** / Every year beginning  at age 79 and continuing for as long as you are in good health. Consult with your caregiver.  Pap test.** / Every 3 years starting at age 70 through age 48 or 33 with a 3 consecutive normal Pap tests. Testing can be stopped between 65 and 70 with 3 consecutive normal Pap tests and no abnormal Pap or HPV tests in the past 10 years.  HPV screening.** / Every 3 years from ages 29 through ages 80 or 91 with a history of 3 consecutive normal Pap tests. Testing can be stopped between 65 and 70 with 3 consecutive normal Pap tests and no abnormal Pap or HPV tests in the past 10 years.  Fecal occult  blood test (FOBT) of stool. / Every year beginning at age 38 and continuing until age 74. You may not need to do this test if you get a colonoscopy every 10 years.  Flexible sigmoidoscopy or colonoscopy.** / Every 5 years for a flexible sigmoidoscopy or every 10 years for a colonoscopy beginning at age 29 and continuing until age 78.  Hepatitis C blood test.** / For all people born from [birth year] through 1966 and any individual with known risks for hepatitis C.  Osteoporosis screening.** / A one-time screening for women ages 40 and over and women at risk for fractures or osteoporosis.  Skin self-exam. / Monthly.  Influenza vaccine. / Every year.  Tetanus, diphtheria, and acellular pertussis (Tdap/Td) vaccine.** / 1 dose of Td every 10 years.  Varicella vaccine.** / Consult your caregiver.  Zoster vaccine.** / 1 dose for adults aged 62 years or older.  Pneumococcal 13-valent conjugate (PCV13) vaccine.** / Consult your caregiver.  Pneumococcal polysaccharide (PPSV23) vaccine.** / 1 dose for all adults aged 86 years and older.  Meningococcal vaccine.** / Consult your caregiver.  Hepatitis A vaccine.** / Consult your caregiver.  Hepatitis B vaccine.** / Consult your caregiver.  Haemophilus influenzae type b (Hib) vaccine.** / Consult your caregiver. ** Family history and personal history of risk and conditions may change your caregiver's recommendations. Document Released: 10/07/2001 Document Revised: 12/06/2012 Document Reviewed: 01/06/2011 [facility] Patient Information 2014 [place], [place].

## 2013-07-05 NOTE — Progress Notes (Signed)
Subjective:    Patient ID: Lauren Wise, female    DOB: 1966-06-29, 47 y.o.   MRN: [id number]  HPI  Aziah is here today for her annual CPE.  She also wants to discuss the conditions listed below:   1)  Knee Swelling:  She has been having swelling of her knees for several months.  She feels that this problem is slowly improving.  She would like to be referred to an orthopaedist.    2)  Headache:  She has run out of her Maxalt.  She also wants to get Trokendi (100 mg) prescribed for a 90 day supply.      Review of Systems  Constitutional: Negative.   HENT: Negative.   Eyes: Negative.   Respiratory: Negative.   Cardiovascular: Negative.   Endocrine: Negative.   Genitourinary: Negative.   Musculoskeletal: Positive for joint swelling.       Knees  Skin: Negative.   Allergic/Immunologic: Negative.   Neurological: Negative.   Hematological: Negative.   Psychiatric/Behavioral: Negative.      Past Medical History  Diagnosis Date  . Obesity   . Wheezing without diagnosis of asthma   . Varicose veins of right lower extremity with pain   . Allergy     Tree Nuts      Family History  Problem Relation Age of Onset  . Diabetes Mother   . Heart disease Maternal Grandmother   . Heart disease Paternal Grandfather   . Stroke Paternal Grandfather     History   Social History Narrative   Marital Status: Married Programmer, multimedia)    Children:  Daughter Lupita Leash) Son Reuel Boom)    Pets: Dog (1)    Living Situation: Lives with her husband and daughter.   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 Constitutional: She is oriented to person, place, and time. She appears well-developed and well-nourished.  HENT:  Head:  Normocephalic and atraumatic.  Right Ear: External ear normal.  Left Ear: External ear normal.  Nose: Nose normal.  Mouth/Throat: Oropharynx is clear and moist.  Eyes: Conjunctivae and EOM are normal. Pupils are equal, round, and reactive to light.  Neck: Normal range of motion. No thyromegaly present.  Cardiovascular: Normal rate, regular rhythm, normal heart sounds and intact distal pulses.  Exam reveals no gallop and no friction rub.   No murmur heard. Pulmonary/Chest: Effort normal and breath sounds normal. Right breast exhibits no inverted nipple, no mass, no nipple discharge, no skin change and no tenderness. Left breast exhibits no inverted nipple, no mass, no nipple discharge, no skin change and no tenderness. Breasts are symmetrical.  Abdominal: Soft. Bowel sounds are normal. Hernia confirmed negative in the right inguinal area and confirmed negative in the left inguinal area.  Genitourinary: Vagina normal and uterus normal. Pelvic exam was performed with patient supine. There is no rash, tenderness or lesion on the right labia. There is no rash, tenderness or lesion on the left labia. No vaginal discharge found.  Musculoskeletal: Normal range of motion. She exhibits no edema and no tenderness.  Lymphadenopathy:    She has no cervical  adenopathy.       Right: No inguinal adenopathy present.       Left: No inguinal adenopathy present.  Neurological: She is alert and oriented to person, place, and time. She has normal reflexes.  Skin: Skin is warm and dry.  Psychiatric: She has a normal mood and affect. Her behavior is normal. Judgment and thought content normal.      Assessment & Plan:    Lavonna was seen today for annual exam.  Diagnoses and associated orders for this visit:  Routine general medical examination at a health care facility - POCT urinalysis dipstick  Need for prophylactic vaccination and inoculation against influenza - Flu Vaccine QUAD 36+ mos PF IM  (Fluarix)  Migraine, unspecified, without mention of intractable migraine without mention of status migrainosus - Topiramate ER (TROKENDI XR) 100 MG CP24; Take 100 mg by mouth daily at 6 (six) AM. Take 1 capsule daily.  Overweight - phentermine (ADIPEX-P) 37.5 MG tablet; Take 1 tablet (37.5 mg total) by mouth daily before breakfast.

## 2013-08-29 DIAGNOSIS — Z23 Encounter for immunization: Secondary | ICD-10-CM | POA: Insufficient documentation

## 2013-08-29 DIAGNOSIS — Z0000 Encounter for general adult medical examination without abnormal findings: Secondary | ICD-10-CM | POA: Insufficient documentation

## 2013-08-29 NOTE — Assessment & Plan Note (Signed)
The patient confirmed that they are not allergic to eggs and have never had a bad reaction with the flu shot in the past.  The vaccination was given without difficulty.   

## 2013-09-24 DIAGNOSIS — E663 Overweight: Secondary | ICD-10-CM | POA: Insufficient documentation

## 2013-09-24 DIAGNOSIS — G43909 Migraine, unspecified, not intractable, without status migrainosus: Secondary | ICD-10-CM | POA: Insufficient documentation

## 2013-10-28 ENCOUNTER — Encounter: Payer: Self-pay | Admitting: Internal Medicine

## 2013-10-28 NOTE — Telephone Encounter (Signed)
Note taken in error

## 2014-03-28 ENCOUNTER — Other Ambulatory Visit: Payer: Self-pay

## 2014-03-28 DIAGNOSIS — Z1231 Encounter for screening mammogram for malignant neoplasm of breast: Secondary | ICD-10-CM

## 2014-05-03 ENCOUNTER — Ambulatory Visit
Admission: RE | Admit: 2014-05-03 | Discharge: 2014-05-03 | Disposition: A | Discharge status: Home / Self Care | Payer: 59 | Source: Ambulatory Visit

## 2014-05-03 DIAGNOSIS — Z1231 Encounter for screening mammogram for malignant neoplasm of breast: Secondary | ICD-10-CM

## 2015-03-07 HISTORY — PX: COLONOSCOPY: SHX174

## 2015-05-21 ENCOUNTER — Ambulatory Visit
Admission: RE | Admit: 2015-05-21 | Discharge: 2015-05-21 | Disposition: A | Discharge status: Home / Self Care | Payer: Commercial Managed Care - HMO | Source: Ambulatory Visit

## 2015-05-21 ENCOUNTER — Other Ambulatory Visit: Payer: Self-pay

## 2015-05-21 DIAGNOSIS — Z1231 Encounter for screening mammogram for malignant neoplasm of breast: Secondary | ICD-10-CM

## 2015-06-08 ENCOUNTER — Encounter (HOSPITAL_BASED_OUTPATIENT_CLINIC_OR_DEPARTMENT_OTHER): Payer: Self-pay | Admitting: *Deleted

## 2015-06-08 NOTE — Progress Notes (Signed)
NPO AFTER MN WITH EXCEPTION CLEAR LIQUIDS UNTIL 0730 (NO MILK/ CREAM PRODUCTS).  ARRIVE AT 1130. NEEDS HG. WILL TAKE AM MED DOS W/ SIPS OF WATER.

## 2015-06-15 ENCOUNTER — Encounter (HOSPITAL_BASED_OUTPATIENT_CLINIC_OR_DEPARTMENT_OTHER)
Admission: RE | Disposition: A | Discharge status: Home / Self Care | Payer: Self-pay | Source: Ambulatory Visit | Attending: Orthopedic Surgery

## 2015-06-15 ENCOUNTER — Ambulatory Visit (HOSPITAL_BASED_OUTPATIENT_CLINIC_OR_DEPARTMENT_OTHER): Payer: Commercial Managed Care - HMO | Admitting: Anesthesiology

## 2015-06-15 ENCOUNTER — Ambulatory Visit (HOSPITAL_BASED_OUTPATIENT_CLINIC_OR_DEPARTMENT_OTHER)
Admission: RE | Admit: 2015-06-15 | Discharge: 2015-06-15 | Disposition: A | Discharge status: Home / Self Care | Payer: Commercial Managed Care - HMO | Source: Ambulatory Visit | Attending: Orthopedic Surgery | Admitting: Orthopedic Surgery

## 2015-06-15 ENCOUNTER — Encounter (HOSPITAL_BASED_OUTPATIENT_CLINIC_OR_DEPARTMENT_OTHER): Payer: Self-pay

## 2015-06-15 DIAGNOSIS — M23231 Derangement of other medial meniscus due to old tear or injury, right knee: Secondary | ICD-10-CM | POA: Diagnosis not present

## 2015-06-15 DIAGNOSIS — M2341 Loose body in knee, right knee: Secondary | ICD-10-CM | POA: Diagnosis not present

## 2015-06-15 DIAGNOSIS — M1711 Unilateral primary osteoarthritis, right knee: Secondary | ICD-10-CM | POA: Insufficient documentation

## 2015-06-15 DIAGNOSIS — M2241 Chondromalacia patellae, right knee: Secondary | ICD-10-CM | POA: Insufficient documentation

## 2015-06-15 HISTORY — DX: Chondromalacia, unspecified site: M94.20

## 2015-06-15 HISTORY — PX: KNEE ARTHROSCOPY: SHX127

## 2015-06-15 HISTORY — DX: Allergic rhinitis, unspecified: J30.9

## 2015-06-15 HISTORY — DX: Personal history of other benign neoplasm: Z86.018

## 2015-06-15 HISTORY — DX: Unspecified tear of unspecified meniscus, current injury, right knee, initial encounter: S83.206A

## 2015-06-15 HISTORY — DX: Asymptomatic varicose veins of unspecified lower extremity: I83.90

## 2015-06-15 HISTORY — DX: Migraine, unspecified, not intractable, without status migrainosus: G43.909

## 2015-06-15 HISTORY — DX: Presence of spectacles and contact lenses: Z97.3

## 2015-06-15 HISTORY — DX: Unspecified osteoarthritis, unspecified site: M19.90

## 2015-06-15 LAB — POCT HEMOGLOBIN-HEMACUE: Hemoglobin: 13.1 g/dL (ref 12.0–15.0)

## 2015-06-15 SURGERY — ARTHROSCOPY, KNEE
Anesthesia: General | Site: Knee | Laterality: Right

## 2015-06-15 MED ORDER — CEFAZOLIN SODIUM-DEXTROSE 2-3 GM-% IV SOLR
INTRAVENOUS | Status: AC
  Filled 2015-06-15: qty 50

## 2015-06-15 MED ORDER — PROMETHAZINE HCL 25 MG/ML IJ SOLN
6.2500 mg | INTRAMUSCULAR | Status: DC | PRN
  Filled 2015-06-15: qty 1

## 2015-06-15 MED ORDER — HYDROCODONE-ACETAMINOPHEN 5-325 MG PO TABS: 1.0000 | ORAL_TABLET | Freq: Four times a day (QID) | ORAL | Status: AC | PRN

## 2015-06-15 MED ORDER — KETOROLAC TROMETHAMINE 30 MG/ML IJ SOLN
INTRAMUSCULAR | Status: DC | PRN
  Administered 2015-06-15: 30 mg via INTRAVENOUS

## 2015-06-15 MED ORDER — LACTATED RINGERS IV SOLN
INTRAVENOUS | Status: DC
  Filled 2015-06-15: qty 1000

## 2015-06-15 MED ORDER — MEPERIDINE HCL 25 MG/ML IJ SOLN
6.2500 mg | INTRAMUSCULAR | Status: DC | PRN
  Filled 2015-06-15: qty 1

## 2015-06-15 MED ORDER — MIDAZOLAM HCL 5 MG/5ML IJ SOLN
INTRAMUSCULAR | Status: DC | PRN
  Administered 2015-06-15: 2 mg via INTRAVENOUS

## 2015-06-15 MED ORDER — FENTANYL CITRATE (PF) 100 MCG/2ML IJ SOLN
25.0000 ug | INTRAMUSCULAR | Status: DC | PRN
  Filled 2015-06-15: qty 1

## 2015-06-15 MED ORDER — MIDAZOLAM HCL 2 MG/2ML IJ SOLN
INTRAMUSCULAR | Status: AC
  Filled 2015-06-15: qty 2

## 2015-06-15 MED ORDER — LIDOCAINE HCL (CARDIAC) 20 MG/ML IV SOLN
INTRAVENOUS | Status: DC | PRN
  Administered 2015-06-15: 70 mg via INTRAVENOUS

## 2015-06-15 MED ORDER — LACTATED RINGERS IV SOLN
INTRAVENOUS | Status: DC
  Administered 2015-06-15: 13:00:00 via INTRAVENOUS
  Filled 2015-06-15: qty 1000

## 2015-06-15 MED ORDER — LIDOCAINE-EPINEPHRINE (PF) 1 %-1:200000 IJ SOLN
INTRAMUSCULAR | Status: DC | PRN
  Administered 2015-06-15: 10 mL

## 2015-06-15 MED ORDER — FENTANYL CITRATE (PF) 100 MCG/2ML IJ SOLN
INTRAMUSCULAR | Status: DC | PRN
  Administered 2015-06-15 (×2): 50 ug via INTRAVENOUS

## 2015-06-15 MED ORDER — DEXAMETHASONE SODIUM PHOSPHATE 4 MG/ML IJ SOLN
INTRAMUSCULAR | Status: DC | PRN
  Administered 2015-06-15: 10 mg via INTRAVENOUS

## 2015-06-15 MED ORDER — ACETAMINOPHEN 10 MG/ML IV SOLN
INTRAVENOUS | Status: DC | PRN
  Administered 2015-06-15: 1000 mg via INTRAVENOUS

## 2015-06-15 MED ORDER — MELOXICAM 7.5 MG PO TABS: 15.0000 mg | ORAL_TABLET | Freq: Every day | ORAL | Status: AC

## 2015-06-15 MED ORDER — BUPIVACAINE-EPINEPHRINE 0.25% -1:200000 IJ SOLN
INTRAMUSCULAR | Status: DC | PRN
  Administered 2015-06-15: 10 mL

## 2015-06-15 MED ORDER — CEFAZOLIN SODIUM-DEXTROSE 2-3 GM-% IV SOLR
2.0000 g | INTRAVENOUS | Status: AC
  Administered 2015-06-15: 2 g via INTRAVENOUS
  Filled 2015-06-15: qty 50

## 2015-06-15 MED ORDER — FENTANYL CITRATE (PF) 100 MCG/2ML IJ SOLN
INTRAMUSCULAR | Status: AC
  Filled 2015-06-15: qty 4

## 2015-06-15 MED ORDER — CHLORHEXIDINE GLUCONATE 4 % EX LIQD
60.0000 mL | Freq: Once | CUTANEOUS | Status: DC
  Filled 2015-06-15: qty 60

## 2015-06-15 MED ORDER — METHOCARBAMOL 500 MG PO TABS: 500.0000 mg | ORAL_TABLET | Freq: Four times a day (QID) | ORAL | Status: AC

## 2015-06-15 MED ORDER — ASPIRIN EC 325 MG PO TBEC: 325.0000 mg | DELAYED_RELEASE_TABLET | Freq: Every day | ORAL | Status: AC

## 2015-06-15 MED ORDER — PROPOFOL 10 MG/ML IV BOLUS
INTRAVENOUS | Status: DC | PRN
  Administered 2015-06-15: 200 mg via INTRAVENOUS

## 2015-06-15 MED ORDER — SODIUM CHLORIDE 0.9 % IR SOLN
Status: DC | PRN
  Administered 2015-06-15: 6000 mL

## 2015-06-15 MED ORDER — ONDANSETRON HCL 4 MG/2ML IJ SOLN
INTRAMUSCULAR | Status: DC | PRN
  Administered 2015-06-15: 4 mg via INTRAVENOUS

## 2015-06-15 SURGICAL SUPPLY — 32 items
BANDAGE ELASTIC 6 VELCRO ST LF (GAUZE/BANDAGES/DRESSINGS) ×2 IMPLANT
BLADE 4.2CUDA (BLADE) IMPLANT
BLADE CUDA SHAVER 3.5 (BLADE) ×2 IMPLANT
BLADE SURG 11 STRL SS (BLADE) ×1 IMPLANT
CLOTH BEACON ORANGE TIMEOUT ST (SAFETY) ×2 IMPLANT
DRAPE ARTHROSCOPY W/POUCH 114 (DRAPES) ×2 IMPLANT
DRSG EMULSION OIL 3X3 NADH (GAUZE/BANDAGES/DRESSINGS) ×2 IMPLANT
DURAPREP 26ML APPLICATOR (WOUND CARE) ×2 IMPLANT
ELECT MENISCUS 165MM 90D (ELECTRODE) IMPLANT
ELECT REM PT RETURN 9FT ADLT (ELECTROSURGICAL)
ELECTRODE REM PT RTRN 9FT ADLT (ELECTROSURGICAL) IMPLANT
GLOVE BIO SURGEON STRL SZ7.5 (GLOVE) ×2 IMPLANT
GOWN STRL REUS W/ TWL LRG LVL3 (GOWN DISPOSABLE) ×1 IMPLANT
GOWN STRL REUS W/TWL LRG LVL3 (GOWN DISPOSABLE) ×2
IV NS IRRIG 3000ML ARTHROMATIC (IV SOLUTION) ×2 IMPLANT
KIT ROOM TURNOVER WOR (KITS) ×2 IMPLANT
KNEE WRAP E Z 3 GEL PACK (MISCELLANEOUS) ×2 IMPLANT
MANIFOLD NEPTUNE II (INSTRUMENTS) ×1 IMPLANT
PACK ARTHROSCOPY DSU (CUSTOM PROCEDURE TRAY) ×2 IMPLANT
PACK BASIN DAY SURGERY FS (CUSTOM PROCEDURE TRAY) ×2 IMPLANT
PADDING CAST COTTON 6X4 STRL (CAST SUPPLIES) ×1 IMPLANT
PENCIL BUTTON HOLSTER BLD 10FT (ELECTRODE) IMPLANT
SET ARTHROSCOPY TUBING (MISCELLANEOUS) ×2
SET ARTHROSCOPY TUBING LN (MISCELLANEOUS) ×1 IMPLANT
SPONGE GAUZE 4X4 12PLY (GAUZE/BANDAGES/DRESSINGS) ×2 IMPLANT
SPONGE GAUZE 4X4 12PLY STER LF (GAUZE/BANDAGES/DRESSINGS) ×1 IMPLANT
SUT ETHILON 4 0 PS 2 18 (SUTURE) ×2 IMPLANT
SYR 30ML LL (SYRINGE) ×2 IMPLANT
SYR CONTROL 10ML LL (SYRINGE) ×2 IMPLANT
TOWEL OR 17X24 6PK STRL BLUE (TOWEL DISPOSABLE) ×2 IMPLANT
TUBE CONNECTING 12X1/4 (SUCTIONS) ×2 IMPLANT
WATER STERILE IRR 500ML POUR (IV SOLUTION) ×2 IMPLANT

## 2015-06-15 NOTE — Anesthesia Postprocedure Evaluation (Signed)
  Anesthesia Post-op Note  Patient: Lauren Wise  Procedure(s) Performed: Procedure(s) (LRB): ARTHROSCOPY RIGHT KNEE WITH DEBRIDEMENT (Right)  Patient Location: PACU  Anesthesia Type: General  Level of Consciousness: awake and alert   Airway and Oxygen Therapy: Patient Spontanous Breathing  Post-op Pain: mild  Post-op Assessment: Post-op Vital signs reviewed, Patient's Cardiovascular Status Stable, Respiratory Function Stable, Patent Airway and No signs of Nausea or vomiting  Last Vitals:  Filed Vitals:   06/15/15 1445  BP: 115/72  Pulse: 73  Temp:   Resp: 13    Post-op Vital Signs: stable   Complications: No apparent anesthesia complications

## 2015-06-15 NOTE — Anesthesia Procedure Notes (Signed)
Procedure Name: LMA Insertion Date/Time: 06/15/2015 1:51 PM Performed by: Maris BergerENENNY, Lauren Wise Pre-anesthesia Checklist: Patient identified, Emergency Drugs available, Suction available and Patient being monitored Patient Re-evaluated:Patient Re-evaluated prior to inductionOxygen Delivery Method: Circle System Utilized Preoxygenation: Pre-oxygenation with 100% oxygen Intubation Type: IV induction Ventilation: Mask ventilation without difficulty LMA: LMA inserted LMA Size: 4.0 Number of attempts: 1 Airway Equipment and Method: bite block Placement Confirmation: positive ETCO2 Dental Injury: Teeth and Oropharynx as per pre-operative assessment

## 2015-06-15 NOTE — Discharge Instructions (Signed)
WBAT right leg, crutches as needed Ice to right knee 20-30 min per hour for swelling and pain  May shower when ever just cover incisions with bandaids and prevent soaking until sutures out  Post Anesthesia Home Care Instructions  Activity: Get plenty of rest for the remainder of the day. A responsible adult should stay with you for 24 hours following the procedure.  For the next 24 hours, DO NOT: -Drive a car -Advertising copywriterperate machinery -Drink alcoholic beverages -Take any medication unless instructed by your physician -Make any legal decisions or sign important papers.  Meals: Start with liquid foods such as gelatin or soup. Progress to regular foods as tolerated. Avoid greasy, spicy, heavy foods. If nausea and/or vomiting occur, drink only clear liquids until the nausea and/or vomiting subsides. Call your physician if vomiting continues.  Special Instructions/Symptoms: Your throat may feel dry or sore from the anesthesia or the breathing tube placed in your throat during surgery. If this causes discomfort, gargle with warm salt water. The discomfort should disappear within 24 hours.  If you had a scopolamine patch placed behind your ear for the management of post- operative nausea and/or vomiting:  1. The medication in the patch is effective for 72 hours, after which it should be removed.  Wrap patch in a tissue and discard in the trash. Wash hands thoroughly with soap and water. 2. You may remove the patch earlier than 72 hours if you experience unpleasant side effects which may include dry mouth, dizziness or visual disturbances. 3. Avoid touching the patch. Wash your hands with soap and water after contact with the patch.   Call your surgeon if you experience:   1.  Fever over 101.0. 2.  Inability to urinate. 3.  Nausea and/or vomiting. 4.  Extreme swelling or bruising at the surgical site. 5.  Continued bleeding from the incision. 6.  Increased pain, redness or drainage from the  incision. 7.  Problems related to your pain medication. 8. Any change in color, movement and/or sensation 9. Any problems and/or concerns   disturbances.

## 2015-06-15 NOTE — Transfer of Care (Signed)
Immediate Anesthesia Transfer of Care Note  Patient: Winfred LeedsLaura A Mccurley  Procedure(s) Performed: Procedure(s): ARTHROSCOPY RIGHT KNEE WITH DEBRIDEMENT (Right)  Patient Location: PACU  Anesthesia Type:General  Level of Consciousness: sedated and responds to stimulation  Airway & Oxygen Therapy: Patient Spontanous Breathing and Patient connected to nasal cannula oxygen  Post-op Assessment: Report given to RN  Post vital signs: Reviewed and stable  Last Vitals:  Filed Vitals:   06/15/15 1158  BP: 134/81  Pulse: 60  Temp: 36.9 C  Resp: 16    Complications: No apparent anesthesia complications

## 2015-06-15 NOTE — H&P (Signed)
  CC- Lauren Wise is a 49 y.o. female who presents with right knee pain.  HPI- . Knee Pain: Patient presents for follow up on a knee problem involving the  right knee. Onset of the symptoms was several months ago. Inciting event: twisting injury while doing routine activities. Current symptoms include crepitus sensation, giving out, pain located medial jointline and swelling. Pain is aggravated by any weight bearing, lateral movements, pivoting and walking.  Patient has had no prior knee problems. Evaluation to date: plain films: no significant or obvious degenerative changes and MRI: abnormal with findings of medial meniscus tear and chondromalacia. Treatment to date: avoidance of offending activity, corticosteroid injection which was not very effective, prescription NSAIDS which are ineffective and PT which was not very effective.  Past Medical History  Diagnosis Date  . Varicose veins   . Right knee meniscal tear   . History of uterine fibroid   . Chondromalacia   . Migraine   . Allergic rhinitis   . Arthritis   . Wears glasses     Past Surgical History  Procedure Laterality Date  . Vaginal hysterectomy  2002  . Colonoscopy  03-07-2015    Prior to Admission medications   Medication Sig Start Date End Date Taking? Authorizing Provider  azelastine (ASTELIN) 0.1 % nasal spray Place 1 spray into both nostrils as needed for rhinitis. Use in each nostril as directed   Yes Historical Provider, MD  Naproxen Sodium (ALEVE) 220 MG CAPS Take by mouth as needed.   Yes Historical Provider, MD  rizatriptan (MAXALT-MLT) 10 MG disintegrating tablet Take 1 tablet (10 mg total) by mouth as needed for migraine. May repeat in 2 hours if needed 01/04/13  Yes Gillian Scarceobyn K Zanard, MD  Topiramate ER (TROKENDI XR) 100 MG CP24 Take 1 capsule by mouth every morning.   Yes Historical Provider, MD  montelukast (SINGULAIR) 10 MG tablet Take 10 mg by mouth at bedtime.    Historical Provider, MD    antalgic gait,  soft tissue tenderness over right medial jointline, effusion, collateral ligaments intact, normal ipsilateral hip exam, normal ipsilateral foot and ankle exam, normal contralateral knee exam  Physical Examination: General appearance - alert, well appearing, and in no distress Mental status - alert, oriented to person, place, and time Chest - clear to auscultation, no wheezes, symmetric air entry Heart - normal rate and regular rhythm Abdomen - soft, nontender, nondistended, no masses or organomegaly Neurological - alert, oriented, normal speech, no focal findings or movement disorder noted Musculoskeletal - as above Extremities - peripheral pulses normal, no pedal edema, no clubbing or cyanosis Skin - normal coloration and turgor, no rashes, no suspicious skin lesions noted   Asessment/Plan--- Right knee medial meniscal tear plus chondromalacia medially   Plan right knee arthroscopy with meniscal debridement and chondroplasty. Procedure risks and potential comps discussed with patient who elects to proceed. Goals are decreased pain and increased function with a high likelihood of achieving both

## 2015-06-15 NOTE — Brief Op Note (Signed)
06/15/2015  1:18 PM  PATIENT:  Lauren Wise  49 y.o. female  PRE-OPERATIVE DIAGNOSIS:  Right knee, 1.  medial mensical tear, chondromalacia  POST-OPERATIVE DIAGNOSIS:  Right knee, 1.  MMT, 2.  Grade diffuse chondromalacia medial, 3. 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 2 larger loose bodies >2cm indiameter  PROCEDURE:  Procedure(s): ARTHROSCOPY RIGHT KNEE  1. Partial medial meniscectomy 2. Medial and  patellofemoral chondrolpasty, debridement of chondral flaps 3. Removal of larger loose bodies  SURGEON:  Surgeon(s) and Role:    * Durene RomansMatthew Yovanna Cogan, MD - Primary  ANESTHESIA:   general  EBL:   None  BLOOD ADMINISTERED:none  DRAINS: none   LOCAL MEDICATIONS USED:  MARCAINE     SPECIMEN:  No Specimen  DISPOSITION OF SPECIMEN:  N/A  COUNTS:  YES  TOURNIQUET:  * No tourniquets in log *  DICTATION: .Other Dictation: Dictation Number [id number]  PLAN OF CARE: Discharge to home after PACU  PATIENT DISPOSITION:  PACU - hemodynamically stable.   Delay start of Pharmacological VTE agent (>24hrs) due to surgical blood loss or risk of bleeding: no

## 2015-06-15 NOTE — Anesthesia Preprocedure Evaluation (Signed)
Anesthesia Evaluation  Patient identified by MRN, date of birth, ID band Patient awake    Reviewed: Allergy & Precautions, NPO status , Patient's Chart, lab work & pertinent test results  Airway Mallampati: II  TM Distance: >3 FB Neck ROM: Full    Dental no notable dental hx.    Pulmonary neg pulmonary ROS,    Pulmonary exam normal breath sounds clear to auscultation       Cardiovascular negative cardio ROS Normal cardiovascular exam Rhythm:Regular Rate:Normal     Neuro/Psych negative neurological ROS  negative psych ROS   GI/Hepatic negative GI ROS, Neg liver ROS,   Endo/Other  negative endocrine ROS  Renal/GU negative Renal ROS  negative genitourinary   Musculoskeletal negative musculoskeletal ROS (+)   Abdominal   Peds negative pediatric ROS (+)  Hematology negative hematology ROS (+)   Anesthesia Other Findings   Reproductive/Obstetrics negative OB ROS                             Anesthesia Physical Anesthesia Plan  ASA: II  Anesthesia Plan: General   Post-op Pain Management:    Induction: Intravenous  Airway Management Planned: LMA  Additional Equipment:   Intra-op Plan:   Post-operative Plan: Extubation in OR  Informed Consent: I have reviewed the patients History and Physical, chart, labs and discussed the procedure including the risks, benefits and alternatives for the proposed anesthesia with the patient or authorized representative who has indicated his/her understanding and acceptance.   Dental advisory given  Plan Discussed with: CRNA  Anesthesia Plan Comments:         Anesthesia Quick Evaluation  

## 2015-06-16 NOTE — Op Note (Signed)
NAME: Lauren Wise, Lauren Wise                           ACCOUNT NO.:  [id number]  MEDICAL RECORD NO.:  [id number]  LOCATION:                                 FACILITY:  PHYSICIAN:  Madlyn FrankelMatthew D. Charlann Boxerlin, M.D.       DATE OF BIRTH:  DATE OF PROCEDURE:  06/15/2015 DATE OF DISCHARGE:                              OPERATIVE REPORT   PREOPERATIVE DIAGNOSIS:  Right knee medial meniscal tear associated with degenerative joint changes.  POSTOPERATIVE DIAGNOSES/FINDINGS: 1. Medial meniscal tear with degenerative central portion of the medial     meniscus as well as meniscal instability to posterior horn to midbody region as well as     anterior horn tearing. 2. Grade III diffuse degenerative change in the medial compartment of     the knee. 3. 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 4. Large loose bodies identified in the knee measuring greater than     at least 1.5 cm each.  PROCEDURE: 1. Right knee diagnostic and operative arthroscopy with medial     meniscectomy, partial. 2. Medial and patellofemoral chondroplasty, debridement of chondral     flaps. 3. Removal of large loose bodies.  SURGEON:  Madlyn FrankelMatthew D. Charlann Boxerlin, M.D.  ASSISTANT:  Surgical team.  ANESTHESIA:  General.  Note that preoperative portal site block as well as an intra-articular block with Marcaine at the end of the case was carried out.  COMPLICATIONS:  None.  TOURNIQUET:  None utilized.  DRAINS:  None utilized in the case.  INDICATION FOR PROCEDURE:  Ms. Lauren Wise is a 49 yo female who had presented to the office for recurring right knee symptoms.  She had mechanical based symptoms that failed to respond to conservative measures.  Plain radiographs revealed relative preservation of the joint space, and subsequent MRI evaluation of the knee was carried out.  The MRI had revealed a degenerative tear into the medial meniscus anterior horn.  She was also noted to have an advanced degenerative change in the  medial and patellofemoral.  There was noted to be 2 large intra-articular osteochondral bodies noted greater than 1.5 cm in diamter  Given the persistence and recurring of her symptoms, we discussed the possibility of arthroscopic surgery for management of this.  She wished to proceed in this fashion as opposed to proceed with arthroplasty based on her age at 49 years old.  The risks of infection and DVT were reviewed, with risks of persistent recurring problems despite attempts at conservative measures.  She wished to proceed.  Consent was obtained.  PROCEDURE IN DETAIL:  The patient was brought to the operative theater. Once adequate anesthesia, preop antibiotics, Ancef administered, she was positioned supine on the OR table.  The right leg was placed over a leg holder.  The right lower extremity was then prepped and draped in a sterile fashion.  Time-out was performed identifying the patient, planned procedure, extremity.  The portal sites were injected with lidocaine.  We then created inferomedial, superomedial, and inferolateral portals.  Diagnostic evaluation revealed the above findings.  Attention was first directed to the medial compartment.  Using a combination of biting  basket and 3.5 Cuda shaver, a partial medial meniscectomy was carried out involving the anterior horn of meniscus as well as biting basket to the unstable posterior horn to midbody of the meniscus.  Following the partial meniscectomy, remaining meniscus was found to be intact and stable.  Following the satisfactory debridement and stabilization of medial compartment I moved through the anterior aspect of the knee.  This is where I identified the first of these two large loose bodies.  I created a slightly larger medial arthrotomy and then used a pituitary rongeur. After a few attempts in breaking this up I was able to remove a single large segment measured about 1 cm.  The 3.5 Cuda shaver was used to remove  remaining Chondral fragments.  The lateral part was noted to be relatively intact despite having diffuse degenerative changes noted on the MRI. There was no chondral flaps or meniscal pathology needed to be addressed.  Then I went in the anterior aspect of the knee performing synovectomy, Identifying the other large loose body, in addition to the diffuse degenerative changes throughout the anterior aspect of the knee, for which, I debrided chondral flaps.  She was noted to have diffuse grade IV changes which I did not feel would benefit from abrasion or microfracture chondroplasty.  I spent time really just to debride the knee of any kind of chondral fragments that were floating around and performed synovectomy as needed for visualization.  During this course of procedure, we identified a second large loose body, which I again used a pituitary rongeur, broke it up a bit and then removed the second large fragment.  Once I was satisfied with the overall debridement following repeat re- examination of the knee several different times in all 3 compartments, the instrumentation was removed from the knee.  Portal sites were reapproximated using 4-0 nylon.  The knee was injected in the end of the case with 0.25% Marcaine with epinephrine.  The knee was then dressed sterilely and wrapped into a sterile bulky wrap.  She was then brought to the recovery room in stable condition, tolerating the procedure well. Findings were reviewed with her husband.  I will have the opportunity to review the photes with her in the office and will see her back in the office in 10-12 days. At this point, I will recommend a postoperative course of physical therapy for strengthening and stabilizing the knee due to the arthritis identified as well as  cortisone injections followed by viscosupplementation in an attempt to maintain her  joint function prior to her considering knee arthroplasty.     Madlyn Frankel Charlann Boxer,  M.D.     MDO/MEDQ  D:  06/16/2015  T:  06/16/2015  Job:  [id number]

## 2015-06-19 ENCOUNTER — Encounter (HOSPITAL_BASED_OUTPATIENT_CLINIC_OR_DEPARTMENT_OTHER): Payer: Self-pay | Admitting: Orthopedic Surgery

## 2015-06-28 ENCOUNTER — Encounter: Payer: Self-pay | Admitting: Physical Therapy

## 2015-06-28 ENCOUNTER — Ambulatory Visit: Payer: Commercial Managed Care - HMO | Attending: Orthopedic Surgery | Admitting: Physical Therapy

## 2015-06-28 DIAGNOSIS — M25661 Stiffness of right knee, not elsewhere classified: Secondary | ICD-10-CM | POA: Diagnosis present

## 2015-06-28 DIAGNOSIS — M25561 Pain in right knee: Secondary | ICD-10-CM

## 2015-06-28 DIAGNOSIS — R262 Difficulty in walking, not elsewhere classified: Secondary | ICD-10-CM | POA: Insufficient documentation

## 2015-06-28 DIAGNOSIS — R269 Unspecified abnormalities of gait and mobility: Secondary | ICD-10-CM | POA: Insufficient documentation

## 2015-06-28 NOTE — Therapy (Addendum)
[facility] [address]  [address] [place], [place], [address] Phone: [phone]   Fax:  [phone]  Physical Therapy Evaluation  Patient Details  Name: Winfred LeedsLaura A Tankard MRN: [id number] Date of Birth: 1966/02/18 Referring Provider: Durene RomansMatthew Olin  Encounter Date: 06/28/2015      PT End of Session - 06/28/15 1313    Visit Number 1   Number of Visits 12   Date for PT Re-Evaluation 08/09/15   PT Start Time 1310   PT Stop Time 1356   PT Time Calculation (min) 46 min      Past Medical History  Diagnosis Date  . Varicose veins   . Right knee meniscal tear   . History of uterine fibroid   . Chondromalacia   . Migraine   . Allergic rhinitis   . Arthritis   . Wears glasses     Past Surgical History  Procedure Laterality Date  . Vaginal hysterectomy  2002  . Colonoscopy  03-07-2015  . Knee arthroscopy Right 06/15/2015    Procedure: ARTHROSCOPY RIGHT KNEE WITH DEBRIDEMENT;  Surgeon: Durene RomansMatthew Olin, MD;  [facility];  Service: Orthopedics;  Laterality: Right;    There were no vitals filed for this visit.  Visit Diagnosis:  Right knee pain - Plan: PT plan of care cert/re-cert  Difficulty walking - Plan: PT plan of care cert/re-cert  Abnormal gait - Plan: PT plan of care cert/re-cert  Knee stiffness, right - Plan: PT plan of care cert/re-cert      Subjective Assessment - 06/28/15 1320    Subjective pt underwent R knee arthroscopic debridement due to OA and medial meniscus [facility] Charlann Boxerlin.  She states she has not required use of crutches since surgery but states did use some prior to surgery.  Currently, she notes most pain at end of day and while in bed.  States she has difficulty sleeping due to pain.   Patient Stated Goals get back to walking and back to work   Currently in Pain? Yes   Pain Score 5   worst pain 8/10 which is noted in PM; 5/10 on AVG with activity    Pain Location Knee   Pain Orientation Right;Anterior   Pain Onset 1 to 4 weeks ago   Pain Frequency Intermittent   Aggravating Factors  pain worsens throughout the day   Pain Relieving Factors ice, elevation            [facility] PT Assessment - 06/28/15 0001    Assessment   Medical Diagnosis s/p R knee arthroscopy   Referring Provider Durene RomansMatthew Olin   Onset Date/Surgical Date 06/15/15   Next MD Visit 07/26/15   Balance Screen   Has the patient fallen in the past 6 months Yes   How many times? 2   Has the patient had a decrease in activity level because of a fear of falling?  No   Is the patient reluctant to leave their home because of a fear of falling?  No   Home Environment   Living Environment Private residence   Type of Home House   Home Layout Two level   Additional Comments employs step-to gait with stairs   Prior Function   Vocation Part time employment   Vocation Requirements Pre-K: a lot of standing, walking, and up/down from floor   Leisure enjoys walking and would like to return to this in some fashion  Observation/Other Assessments   Focus on Therapeutic Outcomes (FOTO)  57% limitation   Functional Tests   Functional tests Squat   Squat   Comments limited to approx 70% parallel and displays large forward wt shift   ROM / Strength   AROM / PROM / Strength AROM;PROM;Strength   AROM   Overall AROM Comments R Knee 14-114   PROM   Overall PROM Comments R Knee 12-115 (limited by pain)   Strength   Strength Assessment Site Hip   Right/Left Hip Right   Right Hip Extension 4+/5   Right Hip ABduction 4/5   Right Hip ADduction 4+/5   Right/Left Knee Right   Right Knee Flexion 4+/5   Right Knee Extension 4-/5        TODAY'S TREATMENT TherEx - QS 10x5", Heel Slide 10x, Bridge 10x, Seated Knee Extension stetch/self-mobes                PT Education - 06/28/15 1536    Education provided Yes   Education Details initial HEP   Person(s) Educated  Patient   Methods Explanation;Demonstration;Handout   Comprehension Verbalized understanding;Returned demonstration          PT Short Term Goals - 06/28/15 1358    PT SHORT TERM GOAL #1   Title pt independent with initial HEP by 07/06/15   Status New           PT Long Term Goals - 06/28/15 1359    PT LONG TERM GOAL #1   Title R Knee AROM 0-135 by 08/09/15   Status New   PT LONG TERM GOAL #2   Title R LE MMT 4+/5 or better without limitation by knee pain by 08/09/15   Status New   PT LONG TERM GOAL #3   Title pt able to perform floor->stand transfers to allow return to work by 08/09/15   Status New   PT LONG TERM GOAL #4   Title pt able to stand and walk at least 1 hour without c/o R knee pain greater than 2/10 by 08/09/15 to allow return to work and performance of chores/ADLs by 08/09/15   Status New   PT LONG TERM GOAL #5   Title Pt displays good mechanics with squats with thighs achieving at least parallel to floor by 08/09/15.   Status New               Plan - 06/28/15 1459    Clinical Impression Statement Pt is 49 y/o female s/p R knee arthroscopic debridement due to OA and meniscus tear.  Surgery performed on 06/15/15 (2 wks ago).  She states her pain has improved since surgery but continues to note pain up to 8/10 some nights and states pain limits ability to sleep at times. She rates AVG pain 5/10 with activity throughout the day. She denies using crutches since surgery.  She reports performing stairs within her home in step-to fashion.  Today's assessment reveals R knee PROM 12-115 and AROM 14-114 with pt c/o pain with both end ranges but firm end-feel is noted with extension indicating likely tissue shortening.  Her gait is safe but slow and with decreased stride length.  She states she has been able to go grocery shopping recently without great difficulty.  Significant edema noted to R knee along with poor patellar mobility.  R Knee EXT MMT limited to 4-/5  (pain) and Flexion 4+/5 (no pain).  Squat mechanics assessed and reveal tendency for forward wt shift and mild  B femur IR.  Pt should benefit from PT to address these impairments and limitations and  I expect her to meet PT goals within 4-6 wks.   Pt will benefit from skilled therapeutic intervention in order to improve on the following deficits Pain;Hypomobility;Decreased strength;Increased edema;Decreased mobility;Decreased range of motion;Difficulty walking;Abnormal gait   Rehab Potential Good   PT Duration 6 weeks   PT Treatment/Interventions Therapeutic exercise;Therapeutic activities;Manual techniques;Vasopneumatic Device;Taping;Balance training;Patient/family education;Functional mobility training;Stair training;Gait training;Cryotherapy;Electrical Stimulation   PT Next Visit Plan R patellar mobes to tolerance; OKC strengthening and mobility exercises R knee; hip stability training to tolerance; modalities PRN for pain and edema.   Consulted and Agree with Plan of Care Patient         Problem List Patient Active Problem List   Diagnosis Date Noted  . Migraine, unspecified, without mention of intractable migraine without mention of status migrainosus 09/24/2013  . Overweight(278.02) 09/24/2013  . Routine general medical examination at a health care facility 08/29/2013  . Need for prophylactic vaccination and inoculation against influenza 08/29/2013  . Allergic rhinitis 06/05/2013  . Varicose veins of leg with pain 01/17/2013  . Headache, migraine 01/17/2013  . Obesity, unspecified 12/09/2012  . H/O seasonal allergies 12/09/2012    [facility] PT, OCS 06/28/2015, 3:37 PM  [facility] [address]  [address] [place], [place], [address] Phone: [phone]   Fax:  [phone]  Name: NYAIRA HODGENS MRN: [id number] Date of Birth: 07-22-1966

## 2015-07-02 ENCOUNTER — Encounter: Payer: Self-pay | Admitting: Rehabilitation

## 2015-07-02 ENCOUNTER — Ambulatory Visit: Payer: Commercial Managed Care - HMO | Admitting: Rehabilitation

## 2015-07-02 DIAGNOSIS — M25561 Pain in right knee: Secondary | ICD-10-CM

## 2015-07-02 DIAGNOSIS — R262 Difficulty in walking, not elsewhere classified: Secondary | ICD-10-CM

## 2015-07-02 DIAGNOSIS — R269 Unspecified abnormalities of gait and mobility: Secondary | ICD-10-CM

## 2015-07-02 NOTE — Therapy (Signed)
[facility] [address]  [address] [place], [place], [address] Phone: [phone]   Fax:  [phone]  Physical Therapy Treatment  Patient Details  Name: Lauren Wise MRN: [id number] Date of Birth: April 13, 1966 Referring Provider: Durene Romans  Encounter Date: 07/02/2015      PT End of Session - 07/02/15 1623    Visit Number 2   Number of Visits 12   Date for PT Re-Evaluation 08/09/15   PT Start Time 1617   PT Stop Time 1656   PT Time Calculation (min) 39 min   Activity Tolerance Patient tolerated treatment well      Past Medical History  Diagnosis Date  . Varicose veins   . Right knee meniscal tear   . History of uterine fibroid   . Chondromalacia   . Migraine   . Allergic rhinitis   . Arthritis   . Wears glasses     Past Surgical History  Procedure Laterality Date  . Vaginal hysterectomy  2002  . Colonoscopy  03-07-2015  . Knee arthroscopy Right 06/15/2015    Procedure: ARTHROSCOPY RIGHT KNEE WITH DEBRIDEMENT;  Surgeon: Durene Romans, MD;  [facility];  Service: Orthopedics;  Laterality: Right;    There were no vitals filed for this visit.  Visit Diagnosis:  Right knee pain  Difficulty walking  Abnormal gait      Subjective Assessment - 07/02/15 1619    Subjective no new complaints.  just sore   Currently in Pain? Yes   Pain Score 5    Pain Location Knee   Pain Orientation Right   Aggravating Factors  worsens throughout the day   Pain Relieving Factors ice, elevation       Bike warm-up and for ROM level1 x QS 10"x10 with bolster under ankle and without roll. Sig tcs/vcs and very low quad activation present   able to get small activation in seated QS + SLR x 10x2 Added to HEP SAQ 6" x 10 Sidelying hip abduction, prone hip extension 2x10 Wise Prone quad stretch 3x20" Prone extension hang x90" Bridging x15  Manual PROM to tolerance into flexion and extension  and tibial APs into extension to tolerance Patellar mob all directions  Decline ice/vaso                             PT Short Term Goals - 06/28/15 1358    PT SHORT TERM GOAL #1   Title pt independent with initial HEP by 07/06/15   Status New           PT Long Term Goals - 06/28/15 1359    PT LONG TERM GOAL #1   Title Wise Knee AROM 0-135 by 08/09/15   Status New   PT LONG TERM GOAL #2   Title Wise LE MMT 4+/5 or better without limitation by knee pain by 08/09/15   Status New   PT LONG TERM GOAL #3   Title pt able to perform floor->stand transfers to allow return to work by 08/09/15   Status New   PT LONG TERM GOAL #4   Title pt able to stand and walk at least 1 hour without c/o Wise knee pain greater than 2/10 by 08/09/15 to allow return to work and performance of chores/ADLs by 08/09/15   Status New   PT LONG TERM GOAL #5   Title Pt displays good mechanics with  squats with thighs achieving at least parallel to floor by 08/09/15.   Status New               Plan - 07/02/15 1656    Clinical Impression Statement tolerated all well.  decreased patellar mobility medially; improved with joint mobilizations.  Low quad activation voluntarily with focus on this today.     PT Next Visit Plan Wise patellar mobes to tolerance; OKC strengthening and mobility exercises Wise knee; hip stability training to tolerance; modalities PRN for pain and edema.        Problem List Patient Active Problem List   Diagnosis Date Noted  . Migraine, unspecified, without mention of intractable migraine without mention of status migrainosus 09/24/2013  . Overweight(278.02) 09/24/2013  . Routine general medical examination at a health care facility 08/29/2013  . Need for prophylactic vaccination and inoculation against influenza 08/29/2013  . Allergic rhinitis 06/05/2013  . Varicose veins of leg with pain 01/17/2013  . Headache, migraine 01/17/2013  . Obesity, unspecified  12/09/2012  . H/O seasonal allergies 12/09/2012    Lauren Wise, Lauren Wise, DPT, CMP 07/02/2015, 4:59 PM  [facility] [address]  [address] [place], [place], [address] Phone: [phone]   Fax:  [phone]  Name: Lauren LeedsLaura A Wise MRN: [id number] Date of Birth: 1966-01-31

## 2015-07-04 ENCOUNTER — Ambulatory Visit: Payer: Commercial Managed Care - HMO | Admitting: Physical Therapy

## 2015-07-04 DIAGNOSIS — M25661 Stiffness of right knee, not elsewhere classified: Secondary | ICD-10-CM

## 2015-07-04 DIAGNOSIS — M25561 Pain in right knee: Secondary | ICD-10-CM

## 2015-07-04 DIAGNOSIS — R269 Unspecified abnormalities of gait and mobility: Secondary | ICD-10-CM

## 2015-07-04 DIAGNOSIS — R262 Difficulty in walking, not elsewhere classified: Secondary | ICD-10-CM

## 2015-07-04 NOTE — Therapy (Signed)
[facility] [address]  [address] [place], [place], [address] Phone: [phone]   Fax:  [phone]  Physical Therapy Treatment  Patient Details  Name: Lauren Wise MRN: [id number] Date of Birth: 15-Apr-1966 Referring Provider: Durene RomansMatthew Olin  Encounter Date: 07/04/2015      PT End of Session - 07/04/15 0719    Visit Number 3   Number of Visits 12   Date for PT Re-Evaluation 08/09/15   PT Start Time 0718   PT Stop Time 0757   PT Time Calculation (min) 39 min      Past Medical History  Diagnosis Date  . Varicose veins   . Right knee meniscal tear   . History of uterine fibroid   . Chondromalacia   . Migraine   . Allergic rhinitis   . Arthritis   . Wears glasses     Past Surgical History  Procedure Laterality Date  . Vaginal hysterectomy  2002  . Colonoscopy  03-07-2015  . Knee arthroscopy Right 06/15/2015    Procedure: ARTHROSCOPY RIGHT KNEE WITH DEBRIDEMENT;  Surgeon: Durene RomansMatthew Olin, MD;  [facility];  Service: Orthopedics;  Laterality: Right;    There were no vitals filed for this visit.  Visit Diagnosis:  Right knee pain  Difficulty walking  Abnormal gait  Knee stiffness, right     TODAY'S TREATMENT TherEx - Nustep lvl 3, 3' B FOB (55cm) tuck AROM 10x B FOB (55cm) tuck with Bridge combo 10x QS into black bolster with ankles on 6" foam roll 10x5"  Manual - grade 2 and 3 R patellar glides inferior and medial with 20-45 deg flexion, R Knee Extension mobes with distraction, contract/relax into knee Ext  TherEx - Bridge on Heels 10x5" SAQ 0# 10x, 2# 10x Seated Fitter R Leg Press 1 Black + 1 Blue 15x, 2 Black 15x B Knee Flexion Machine 20# 2x15 Rocker DF and Knee Ext Stretch 3x20" DL Heel Raise at UBE 84X15x         PT Short Term Goals - 07/04/15 0801    PT SHORT TERM GOAL #1   Title pt independent with initial HEP by 07/06/15   Status Achieved           PT  Long Term Goals - 07/04/15 0801    PT LONG TERM GOAL #1   Title R Knee AROM 0-135 by 08/09/15   Status On-going   PT LONG TERM GOAL #2   Title R LE MMT 4+/5 or better without limitation by knee pain by 08/09/15   Status On-going   PT LONG TERM GOAL #3   Title pt able to perform floor->stand transfers to allow return to work by 08/09/15   Status On-going   PT LONG TERM GOAL #4   Title pt able to stand and walk at least 1 hour without c/o R knee pain greater than 2/10 by 08/09/15 to allow return to work and performance of chores/ADLs by 08/09/15   Status On-going   PT LONG TERM GOAL #5   Title Pt displays good mechanics with squats with thighs achieving at least parallel to floor by 08/09/15.   Status On-going               Plan - 07/04/15 0800    Clinical Impression Statement performed very well, is performing HEP and already noting good improvment in Knee Ext ROM today.  R patella still quite stiff all planes and some tenderness  noted with mobes.   PT Next Visit Plan R patellar mobes to tolerance; OKC strengthening and mobility exercises R knee; hip stability training to tolerance; modalities PRN for pain and edema.   Consulted and Agree with Plan of Care Patient        Problem List Patient Active Problem List   Diagnosis Date Noted  . Migraine, unspecified, without mention of intractable migraine without mention of status migrainosus 09/24/2013  . Overweight(278.02) 09/24/2013  . Routine general medical examination at a health care facility 08/29/2013  . Need for prophylactic vaccination and inoculation against influenza 08/29/2013  . Allergic rhinitis 06/05/2013  . Varicose veins of leg with pain 01/17/2013  . Headache, migraine 01/17/2013  . Obesity, unspecified 12/09/2012  . H/O seasonal allergies 12/09/2012    Haadi Santellan PT, OCS 07/04/2015, 8:01 AM  [facility] [address]  [address] [place], [place],  [address] Phone: [phone]   Fax:  [phone]  Name: Lauren Wise MRN: [id number] Date of Birth: 1966/06/07

## 2015-07-09 ENCOUNTER — Ambulatory Visit: Payer: Commercial Managed Care - HMO | Admitting: Physical Therapy

## 2015-07-11 ENCOUNTER — Ambulatory Visit: Payer: Commercial Managed Care - HMO | Admitting: Physical Therapy

## 2015-07-11 DIAGNOSIS — R262 Difficulty in walking, not elsewhere classified: Secondary | ICD-10-CM

## 2015-07-11 DIAGNOSIS — M25561 Pain in right knee: Secondary | ICD-10-CM

## 2015-07-11 DIAGNOSIS — M25661 Stiffness of right knee, not elsewhere classified: Secondary | ICD-10-CM

## 2015-07-11 DIAGNOSIS — R269 Unspecified abnormalities of gait and mobility: Secondary | ICD-10-CM

## 2015-07-11 NOTE — Therapy (Signed)
[facility] [address]  [address] [place], [place], [address] Phone: [phone]   Fax:  [phone]  Physical Therapy Treatment  Patient Details  Name: Lauren Wise MRN: [id number] Date of Birth: September 13, 1965 Referring Provider: Durene Romans  Encounter Date: 07/11/2015      PT End of Session - 07/11/15 1405    Visit Number 4   Number of Visits 12   Date for PT Re-Evaluation 08/09/15   PT Start Time 1316   PT Stop Time 1404   PT Time Calculation (min) 48 min      Past Medical History  Diagnosis Date  . Varicose veins   . Right knee meniscal tear   . History of uterine fibroid   . Chondromalacia   . Migraine   . Allergic rhinitis   . Arthritis   . Wears glasses     Past Surgical History  Procedure Laterality Date  . Vaginal hysterectomy  2002  . Colonoscopy  03-07-2015  . Knee arthroscopy Right 06/15/2015    Procedure: ARTHROSCOPY RIGHT KNEE WITH DEBRIDEMENT;  Surgeon: Durene Romans, MD;  Location: [facility];  Service: Orthopedics;  Laterality: Right;    There were no vitals filed for this visit.  Visit Diagnosis:  Right knee pain  Difficulty walking  Abnormal gait  Knee stiffness, right      Subjective Assessment - 07/11/15 1318    Subjective States knee has been feeling pretty good lately and hasn't noted any pain in the past 3 days.   Currently in Pain? No/denies            [facility] PT Assessment - 07/11/15 0001    AROM   Overall AROM Comments R Knee 10-119   PROM   Overall PROM Comments R Knee 8-125        TODAY'S TREATMENT TherEx - Nustep lvl 4, 4' SAQ 0# 15x, 2# 15x R Knee Flexion and Extension AROM QS 10x5" into pillow Standing TKE ball into wall 10x3" Rocker DF and Knee Ext Stretch 3x20" DL Heel Raise at UBE 95M TRX DL Squat 84X TRX Lateral Lunge 10x each 6" FW Step-up with Black TB for TKE with single Pole A 15x Fitter BW Lunge 1 Black + 1 Blue 15x each  with B Pole A 15x B Knee Flexion Machine 25# 15x B Knee Extension Machine 15# 15x Standing TKE with Black TB 15x3"            PT Education - 07/11/15 1404    Education provided Yes   Education Details HEP update with CKC TKE   Person(s) Educated Patient   Methods Explanation;Demonstration;Handout   Comprehension Verbalized understanding;Returned demonstration          PT Short Term Goals - 07/04/15 0801    PT SHORT TERM GOAL #1   Title pt independent with initial HEP by 07/06/15   Status Achieved           PT Long Term Goals - 07/11/15 1553    PT LONG TERM GOAL #1   Title R Knee AROM 0-135 by 08/09/15   Status On-going   PT LONG TERM GOAL #2   Title R LE MMT 4+/5 or better without limitation by knee pain by 08/09/15   Status On-going   PT LONG TERM GOAL #3   Title pt able to perform floor->stand transfers to allow return to work by 08/09/15   Status On-going   PT LONG TERM GOAL #  4   Title pt able to stand and walk at least 1 hour without c/o R knee pain greater than 2/10 by 08/09/15 to allow return to work and performance of chores/ADLs by 08/09/15   Status On-going   PT LONG TERM GOAL #5   Title Pt displays good mechanics with squats with thighs achieving at least parallel to floor by 08/09/15.   Status On-going               Plan - 07/11/15 1551    Clinical Impression Statement pt states has been basically pain-free past few days.  Her knee ROM has improved but still with some extension lag.  Added standing CKC TKE to HEP.  She is pleased with current progress and feels should be able to return to work soon.  She is going to be out of town next week so will not been seen by PT.  Will re-assess and progress upon return   PT Next Visit Plan re-assess ROM, patellar mobility, MMT and address as necessary.   Consulted and Agree with Plan of Care Patient        Problem List Patient Active Problem List   Diagnosis Date Noted  . Migraine, unspecified,  without mention of intractable migraine without mention of status migrainosus 09/24/2013  . Overweight(278.02) 09/24/2013  . Routine general medical examination at a health care facility 08/29/2013  . Need for prophylactic vaccination and inoculation against influenza 08/29/2013  . Allergic rhinitis 06/05/2013  . Varicose veins of leg with pain 01/17/2013  . Headache, migraine 01/17/2013  . Obesity, unspecified 12/09/2012  . H/O seasonal allergies 12/09/2012    [facility]Braelen Sproule PT, OCS 07/11/2015, 3:54 PM  [facility] [address]  [address] [place], [place], [address] Phone: [phone]   Fax:  [phone]  Name: Lauren Wise MRN: [id number] Date of Birth: [birth year]

## 2015-07-23 ENCOUNTER — Ambulatory Visit: Payer: Commercial Managed Care - HMO | Admitting: Physical Therapy

## 2015-07-23 DIAGNOSIS — R262 Difficulty in walking, not elsewhere classified: Secondary | ICD-10-CM

## 2015-07-23 DIAGNOSIS — M25561 Pain in right knee: Secondary | ICD-10-CM

## 2015-07-23 DIAGNOSIS — M25661 Stiffness of right knee, not elsewhere classified: Secondary | ICD-10-CM

## 2015-07-23 DIAGNOSIS — R269 Unspecified abnormalities of gait and mobility: Secondary | ICD-10-CM

## 2015-07-23 NOTE — Therapy (Signed)
[facility] [place] [address]  [address] [place], [place], [address] Phone: [phone]   Fax:  [phone]  Physical Therapy Treatment  Patient Details  Name: Lauren LeedsLaura A Villella MRN: [id number] Date of Birth: [birth year] Referring Provider: Durene RomansMatthew Olin  Encounter Date: 07/23/2015      PT End of Session - 07/23/15 1322    Visit Number 5   Number of Visits 12   Date for PT Re-Evaluation 08/09/15   PT Start Time 1318   PT Stop Time 1402   PT Time Calculation (min) 44 min      Past Medical History  Diagnosis Date  . Varicose veins   . Right knee meniscal tear   . History of uterine fibroid   . Chondromalacia   . Migraine   . Allergic rhinitis   . Arthritis   . Wears glasses     Past Surgical History  Procedure Laterality Date  . Vaginal hysterectomy  2002  . Colonoscopy  03-07-2015  . Knee arthroscopy Right 06/15/2015    Procedure: ARTHROSCOPY RIGHT KNEE WITH DEBRIDEMENT;  Surgeon: Durene RomansMatthew Olin, MD;  [facility];  Service: Orthopedics;  Laterality: Right;    There were no vitals filed for this visit.  Visit Diagnosis:  Right knee pain  Difficulty walking  Abnormal gait  Knee stiffness, right      Subjective Assessment - 07/23/15 1321    Subjective States she fell a few days ago and has noted some increase in knee pain since that time.  She denies bruising or increased swelling to knee.   Currently in Pain? Yes   Pain Score 5    Pain Location Knee   Pain Orientation Right           TODAY'S TREATMENT Manual - Seated Grade 2 AP R knee and grade 3 patellar inferior and medial glides  TherEx - Rec Bike lvl 1, 3' Kinesiotape: 3 strips R knee - 75% patellar tendon, 50% medial and lateral to patella SAQ 0# 10x5" QS 10x5" into black bolster with ankle on 6" foam roll SLR 10x R Rocker DF and Knee Ext Stretch 3x20" DL Heel Raise at UBE 28U20x DL Leg Press 13#25# 24M15x with R TKE into  PT's knee B Knee Flexion Machine 25# 2x15 with R TKE at top of motion 6" FW Step-up with Black TB for TKE with single Pole A 15x  Manual - R knee distraction and grade 2 and 3 extension mobs to tolerance              PT Short Term Goals - 07/04/15 0801    PT SHORT TERM GOAL #1   Title pt independent with initial HEP by 07/06/15   Status Achieved           PT Long Term Goals - 07/11/15 1553    PT LONG TERM GOAL #1   Title R Knee AROM 0-135 by 08/09/15   Status On-going   PT LONG TERM GOAL #2   Title R LE MMT 4+/5 or better without limitation by knee pain by 08/09/15   Status On-going   PT LONG TERM GOAL #3   Title pt able to perform floor->stand transfers to allow return to work by 08/09/15   Status On-going   PT LONG TERM GOAL #4   Title pt able to stand and walk at least 1 hour without c/o R knee pain greater than 2/10 by 08/09/15 to allow return to  work and performance of chores/ADLs by 08/09/15   Status On-going   PT LONG TERM GOAL #5   Title Pt displays good mechanics with squats with thighs achieving at least parallel to floor by 08/09/15.   Status On-going               Plan - 07/23/15 1538    Clinical Impression Statement pt with some increased soreness lately due to recent fall. She states she fell forward with hands and chest stiking ground first but knee did hit ground as well.  She denies noting discoloration or significant increased edema to knee following fall.  Applied kinesiology tape to knee today to see if benefit noted.  Decreased intensity of standing CKC exercises today due to soreness.  More manual today with empasis on knee extension but mild lag still present.  Pt to MD later this week so will assess for PR at next appointment.   PT Next Visit Plan re-assess for PR to MD   Consulted and Agree with Plan of Care Patient        Problem List Patient Active Problem List   Diagnosis Date Noted  . Migraine, unspecified, without mention  of intractable migraine without mention of status migrainosus 09/24/2013  . Overweight(278.02) 09/24/2013  . Routine general medical examination at a health care facility 08/29/2013  . Need for prophylactic vaccination and inoculation against influenza 08/29/2013  . Allergic rhinitis 06/05/2013  . Varicose veins of leg with pain 01/17/2013  . Headache, migraine 01/17/2013  . Obesity, unspecified 12/09/2012  . H/O seasonal allergies 12/09/2012    [facility] PT, OCS 07/23/2015, 3:42 PM  [facility] [address]  [address] [place], [place], [address] Phone: [phone]   Fax:  [phone]  Name: Lauren Wise MRN: [id number] Date of Birth: [birth year]

## 2015-07-25 ENCOUNTER — Ambulatory Visit: Payer: Commercial Managed Care - HMO | Admitting: Physical Therapy

## 2015-07-25 DIAGNOSIS — M25561 Pain in right knee: Secondary | ICD-10-CM

## 2015-07-25 DIAGNOSIS — R262 Difficulty in walking, not elsewhere classified: Secondary | ICD-10-CM

## 2015-07-25 DIAGNOSIS — M25661 Stiffness of right knee, not elsewhere classified: Secondary | ICD-10-CM

## 2015-07-25 DIAGNOSIS — R269 Unspecified abnormalities of gait and mobility: Secondary | ICD-10-CM

## 2015-07-25 NOTE — Therapy (Addendum)
Lauren Wise [address]  [place], [place], [address] Phone: [phone]   Fax:  [phone]  Physical Therapy Treatment  Patient Details  Name: Lauren Wise MRN: [id number] Date of Birth: 13-Jun-1966 Referring Provider: Paralee Wise  Encounter Date: 07/25/2015      PT End of Session - 07/25/15 1321    Visit Number 6   Number of Visits 12   Date for PT Re-Evaluation 08/09/15   PT Start Time 6314   PT Stop Time 1403   PT Time Calculation (min) 45 min      Past Medical History  Diagnosis Date  . Varicose veins   . Right knee meniscal tear   . History of uterine fibroid   . Chondromalacia   . Migraine   . Allergic rhinitis   . Arthritis   . Wears glasses     Past Surgical History  Procedure Laterality Date  . Vaginal hysterectomy  2002  . Colonoscopy  03-07-2015  . Knee arthroscopy Right 06/15/2015    Procedure: ARTHROSCOPY RIGHT KNEE WITH DEBRIDEMENT;  Surgeon: Lauren Cancel, MD;  Location: [facility];  Service: Orthopedics;  Laterality: Right;    There were no vitals filed for this visit.  Visit Diagnosis:  Right knee pain  Difficulty walking  Abnormal gait  Knee stiffness, right      Subjective Assessment - 07/25/15 1323    Subjective States pain improved since last treatment rating pain 3/10 today.  States is performing HEP and using ice to knee.   Currently in Pain? Yes   Pain Score 3    Pain Location Knee   Pain Orientation Right;Anterior            [facility] PT Assessment - 07/25/15 0001    AROM   Overall AROM Comments R Knee AROM 8-125   PROM   Overall PROM Comments R Knee PROM 5-130      TODAY'S TREATMENT:  TherEx - Rec Bike lvl 1-2, 4' B FOB (55cm) tuck and bridge combo 15x QS 10x5" into black bolster with ankle on 8" foam roll SAQ 0# 10x5"  Manual - R patellar inferior and medial glides grade 3 in 30 degrees flexion; knee distraction and grade 2 and  3 extension mobs to tolerance (notes anterior knee pain); contract/relax R Hamstring stretch  NMES - [place] protocol for R VMO retraining, 10"/30" on/off for 15 reps while pt performed QS into black bolster (10') Rocker DF and Knee Ext Stretch 3x20" 6" FW Step-up with Black TB for TKE with single Pole A 15x          PT Short Term Goals - 07/04/15 0801    PT SHORT TERM GOAL #1   Title pt independent with initial HEP by 07/06/15   Status Achieved           PT Long Term Goals - 07/25/15 1548    PT LONG TERM GOAL #1   Title R Knee AROM 0-135 by 08/09/15  8-125   Status On-going   PT LONG TERM GOAL #2   Title R LE MMT 4+/5 or better without limitation by knee pain by 08/09/15   Status On-going   PT LONG TERM GOAL #3   Title pt able to perform floor->stand transfers to allow return to work by 08/09/15   Status On-going   PT LONG TERM GOAL #4   Title pt able to stand and walk at least 1 hour without  c/o R knee pain greater than 2/10 by 08/09/15 to allow return to work and performance of chores/ADLs by 08/09/15   Status On-going   PT LONG TERM GOAL #5   Title Pt displays good mechanics with squats with thighs achieving at least parallel to floor by 08/09/15.   Status On-going               Plan - 07/25/15 1542    Clinical Impression Statement [name] has attended 6 PT sessions in the 4 weeks since beginning PT.  She is nearly 6 weeks out from R knee surgery.  She has been making progress with PT but continues to note R knee pain which she has rated 3-5/10 this week.  Her PROM has improved from 12-115 at initial eval to 5-130 today and AROM from 14-114 to 8-125.  We initiated NMES to VMO today which improved TKE from 10 to 8.  Pt is ambulating without need for AD but stride length still short vs normal and R heel strike is on flexed knee.   PT Next Visit Plan continue knee TKE strengthening; NMES PRN; taping PRN; gait and CKC training to tolerance   Consulted and Agree  with Plan of Care Patient        Problem List Patient Active Problem List   Diagnosis Date Noted  . Migraine, unspecified, without mention of intractable migraine without mention of status migrainosus 09/24/2013  . Overweight(278.02) 09/24/2013  . Routine general medical examination at a health care facility 08/29/2013  . Need for prophylactic vaccination and inoculation against influenza 08/29/2013  . Allergic rhinitis 06/05/2013  . Varicose veins of leg with pain 01/17/2013  . Headache, migraine 01/17/2013  . Obesity, unspecified 12/09/2012  . H/O seasonal allergies 12/09/2012    [facility] PT, OCS 07/25/2015, 3:49 PM  [facility] [address]  [place], [place], [address] Phone: [phone]   Fax:  [phone]  Name: Lauren Wise MRN: [id number] Date of Birth: 1965/10/16    PHYSICAL THERAPY DISCHARGE SUMMARY  Visits from Start of Care: 6 of 12  Current functional level related to goals / functional outcomes: Patient was last seen on 07/25/15 at which time goals were not met.  She has not been seen by PT in over 30 days and is therefore being discharged from our care.   Remaining deficits: When last seen R Knee PROM 5-130 and AROM 8-125, notes intermittent pain rating 3/10   Education / Equipment: HEP Plan: Patient agrees to discharge.  Patient goals were not met. Patient is being discharged due to not returning since the last visit.  ?????        Leonette Most PT, OCS 09/04/2015 10:31 AM

## 2015-07-30 ENCOUNTER — Ambulatory Visit: Payer: Commercial Managed Care - HMO | Admitting: Physical Therapy

## 2015-08-01 ENCOUNTER — Ambulatory Visit: Payer: Commercial Managed Care - HMO | Admitting: Physical Therapy

## 2016-04-21 ENCOUNTER — Other Ambulatory Visit: Payer: Self-pay | Admitting: Family Medicine

## 2016-04-21 DIAGNOSIS — Z1231 Encounter for screening mammogram for malignant neoplasm of breast: Secondary | ICD-10-CM

## 2016-05-21 ENCOUNTER — Ambulatory Visit
Admission: RE | Admit: 2016-05-21 | Discharge: 2016-05-21 | Disposition: A | Discharge status: Home / Self Care | Payer: Commercial Managed Care - HMO | Source: Ambulatory Visit | Attending: Family Medicine | Admitting: Family Medicine

## 2016-05-21 DIAGNOSIS — Z1231 Encounter for screening mammogram for malignant neoplasm of breast: Secondary | ICD-10-CM

## 2017-04-09 ENCOUNTER — Other Ambulatory Visit: Payer: Self-pay | Admitting: Family Medicine

## 2017-04-09 DIAGNOSIS — Z1231 Encounter for screening mammogram for malignant neoplasm of breast: Secondary | ICD-10-CM

## 2017-05-25 ENCOUNTER — Ambulatory Visit
Admission: RE | Admit: 2017-05-25 | Discharge: 2017-05-25 | Disposition: A | Discharge status: Home / Self Care | Payer: 59 | Source: Ambulatory Visit | Attending: Family Medicine | Admitting: Family Medicine

## 2017-05-25 DIAGNOSIS — Z1231 Encounter for screening mammogram for malignant neoplasm of breast: Secondary | ICD-10-CM

## 2018-04-27 ENCOUNTER — Other Ambulatory Visit: Payer: Self-pay | Admitting: Family Medicine

## 2018-04-27 DIAGNOSIS — Z1231 Encounter for screening mammogram for malignant neoplasm of breast: Secondary | ICD-10-CM

## 2018-05-27 ENCOUNTER — Ambulatory Visit
Admission: RE | Admit: 2018-05-27 | Discharge: 2018-05-27 | Disposition: A | Discharge status: Home / Self Care | Payer: 59 | Source: Ambulatory Visit | Attending: Family Medicine | Admitting: Family Medicine

## 2018-05-27 DIAGNOSIS — Z1231 Encounter for screening mammogram for malignant neoplasm of breast: Secondary | ICD-10-CM

## 2019-04-21 ENCOUNTER — Other Ambulatory Visit: Payer: Self-pay | Admitting: Family Medicine

## 2019-04-21 DIAGNOSIS — Z1231 Encounter for screening mammogram for malignant neoplasm of breast: Secondary | ICD-10-CM

## 2019-06-03 ENCOUNTER — Other Ambulatory Visit: Payer: Self-pay

## 2019-06-03 ENCOUNTER — Ambulatory Visit
Admission: RE | Admit: 2019-06-03 | Discharge: 2019-06-03 | Disposition: A | Discharge status: Home / Self Care | Payer: 59 | Source: Ambulatory Visit | Attending: Family Medicine | Admitting: Family Medicine

## 2019-06-03 DIAGNOSIS — Z1231 Encounter for screening mammogram for malignant neoplasm of breast: Secondary | ICD-10-CM

## 2019-10-21 ENCOUNTER — Ambulatory Visit: Payer: 59 | Attending: Internal Medicine

## 2019-10-21 DIAGNOSIS — Z23 Encounter for immunization: Secondary | ICD-10-CM

## 2019-10-21 NOTE — Progress Notes (Signed)
   Covid-19 Vaccination Clinic  Name:  YUKTHA KERCHNER    MRN: [id number] DOB: 1966/03/04  10/21/2019  Ms. Shafran was observed post Covid-19 immunization for 15 minutes without incidence. She was provided with Vaccine Information Sheet and instruction to access the V-Safe system.   Ms. Thoennes was instructed to call 911 with any severe reactions post vaccine: Marland Kitchen Difficulty breathing  . Swelling of your face and throat  . A fast heartbeat  . A bad rash all over your body  . Dizziness and weakness    Immunizations Administered    Name Date Dose VIS Date Route   Pfizer COVID-19 Vaccine 10/21/2019  3:48 PM 0.3 mL 08/05/2019 Intramuscular   Manufacturer: [employer], [employer]   Lot: [id number]   NDC: [address]-6

## 2019-11-16 ENCOUNTER — Ambulatory Visit: Payer: 59 | Attending: Internal Medicine

## 2019-11-16 DIAGNOSIS — Z23 Encounter for immunization: Secondary | ICD-10-CM

## 2019-11-16 NOTE — Progress Notes (Signed)
   Covid-19 Vaccination Clinic  Name:  Lauren Wise    MRN: [id number] DOB: July 21, 1966  11/16/2019  [name] was observed post Covid-19 immunization for 15 minutes without incident. She was provided with Vaccine Information Sheet and instruction to access the V-Safe system.   [name] was instructed to call 911 with any severe reactions post vaccine: Marland Kitchen Difficulty breathing  . Swelling of face and throat  . A fast heartbeat  . A bad rash all over body  . Dizziness and weakness   Immunizations Administered    Name Date Dose VIS Date Route   Pfizer COVID-19 Vaccine 11/16/2019  4:31 PM 0.3 mL 08/05/2019 Intramuscular   Manufacturer: [employer], [employer]   Lot: [id number]   NDC: [address]-9

## 2020-05-28 ENCOUNTER — Other Ambulatory Visit: Payer: Self-pay | Admitting: Family Medicine

## 2020-05-28 DIAGNOSIS — Z1231 Encounter for screening mammogram for malignant neoplasm of breast: Secondary | ICD-10-CM

## 2020-06-04 ENCOUNTER — Ambulatory Visit
Admission: RE | Admit: 2020-06-04 | Discharge: 2020-06-04 | Disposition: A | Discharge status: Home / Self Care | Payer: No Typology Code available for payment source | Source: Ambulatory Visit | Attending: Family Medicine | Admitting: Family Medicine

## 2020-06-04 ENCOUNTER — Other Ambulatory Visit: Payer: Self-pay

## 2020-06-04 DIAGNOSIS — Z1231 Encounter for screening mammogram for malignant neoplasm of breast: Secondary | ICD-10-CM

## 2020-11-29 ENCOUNTER — Other Ambulatory Visit: Payer: Self-pay | Admitting: Family Medicine

## 2020-11-29 DIAGNOSIS — E2839 Other primary ovarian failure: Secondary | ICD-10-CM

## 2020-12-20 ENCOUNTER — Other Ambulatory Visit: Payer: Self-pay | Admitting: Family Medicine

## 2020-12-20 DIAGNOSIS — Z1231 Encounter for screening mammogram for malignant neoplasm of breast: Secondary | ICD-10-CM

## 2021-06-06 ENCOUNTER — Ambulatory Visit: Payer: No Typology Code available for payment source

## 2021-07-03 ENCOUNTER — Ambulatory Visit
Admission: RE | Admit: 2021-07-03 | Discharge: 2021-07-03 | Disposition: A | Discharge status: Home / Self Care | Payer: No Typology Code available for payment source | Source: Ambulatory Visit | Attending: Family Medicine | Admitting: Family Medicine

## 2021-07-03 ENCOUNTER — Other Ambulatory Visit: Payer: Self-pay

## 2021-07-03 DIAGNOSIS — Z1231 Encounter for screening mammogram for malignant neoplasm of breast: Secondary | ICD-10-CM

## 2021-08-27 ENCOUNTER — Other Ambulatory Visit: Payer: Self-pay | Admitting: Family Medicine

## 2021-08-27 DIAGNOSIS — E2839 Other primary ovarian failure: Secondary | ICD-10-CM

## 2021-10-14 ENCOUNTER — Other Ambulatory Visit: Payer: No Typology Code available for payment source

## 2022-01-07 ENCOUNTER — Ambulatory Visit
Admission: RE | Admit: 2022-01-07 | Discharge: 2022-01-07 | Disposition: A | Discharge status: Home / Self Care | Payer: No Typology Code available for payment source | Source: Ambulatory Visit | Attending: Family Medicine | Admitting: Family Medicine

## 2022-01-07 DIAGNOSIS — E2839 Other primary ovarian failure: Secondary | ICD-10-CM

## 2022-06-03 ENCOUNTER — Other Ambulatory Visit: Payer: Self-pay | Admitting: Family Medicine

## 2022-06-03 DIAGNOSIS — Z1231 Encounter for screening mammogram for malignant neoplasm of breast: Secondary | ICD-10-CM

## 2022-07-04 ENCOUNTER — Ambulatory Visit
Admission: RE | Admit: 2022-07-04 | Discharge: 2022-07-04 | Disposition: A | Discharge status: Home / Self Care | Payer: No Typology Code available for payment source | Source: Ambulatory Visit | Attending: Family Medicine | Admitting: Family Medicine

## 2022-07-04 DIAGNOSIS — Z1231 Encounter for screening mammogram for malignant neoplasm of breast: Secondary | ICD-10-CM

## 2023-06-02 ENCOUNTER — Other Ambulatory Visit: Payer: Self-pay | Admitting: Family Medicine

## 2023-06-02 DIAGNOSIS — Z1231 Encounter for screening mammogram for malignant neoplasm of breast: Secondary | ICD-10-CM

## 2023-07-07 ENCOUNTER — Inpatient Hospital Stay
Admission: RE | Admit: 2023-07-07 | Discharge: 2023-07-07 | Discharge status: Home / Self Care | Payer: 59 | Source: Ambulatory Visit | Attending: Family Medicine | Admitting: Family Medicine

## 2023-07-07 DIAGNOSIS — Z1231 Encounter for screening mammogram for malignant neoplasm of breast: Secondary | ICD-10-CM

## 2023-08-08 IMAGING — MG MM DIGITAL SCREENING BILAT W/ TOMO AND CAD
8 series · 8 of 24 positions shown · non-contrast
Comparison: Previous exam(s).

CLINICAL DATA: Screening.

EXAM:
DIGITAL SCREENING BILATERAL MAMMOGRAM WITH TOMOSYNTHESIS AND CAD
TECHNIQUE: Bilateral screening digital craniocaudal and mediolateral oblique
mammograms were obtained. Bilateral screening digital breast
tomosynthesis was performed. The images were evaluated with
computer-aided detection.

[L MLO synth-2D]
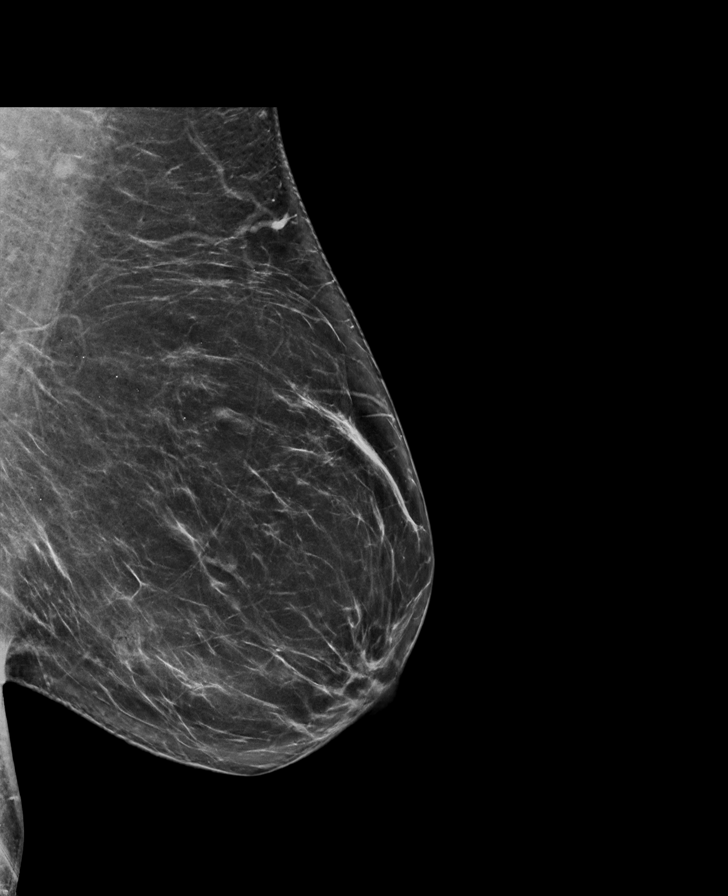

[R MLO synth-2D]
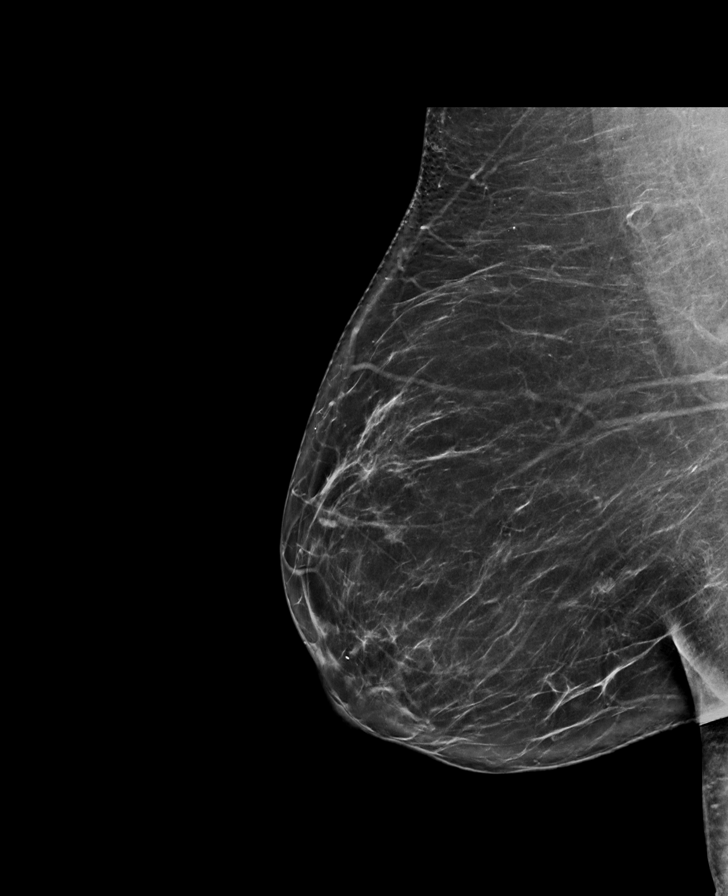

[R CC synth-2D]
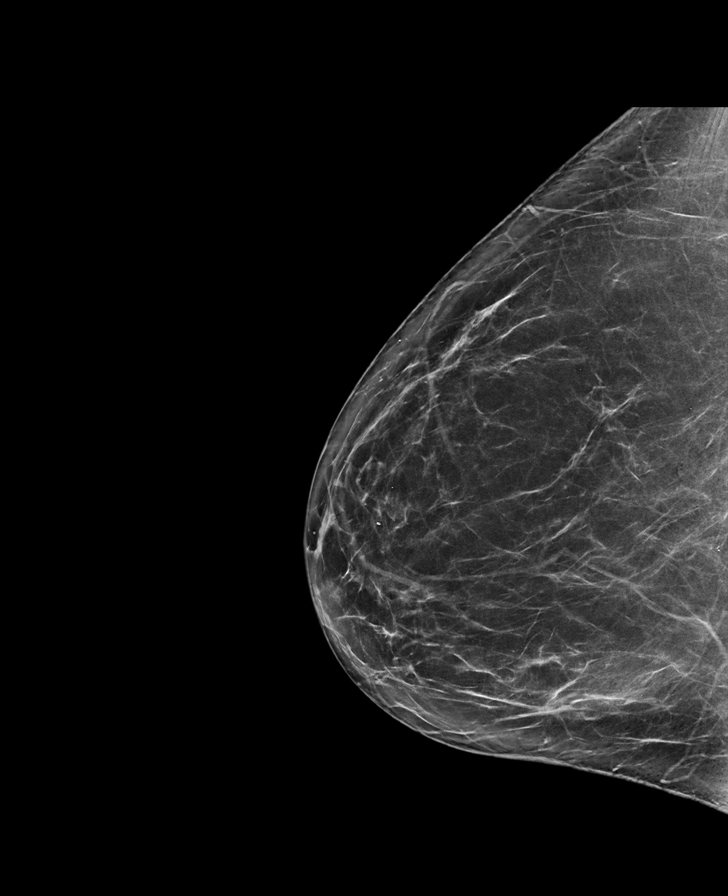

[L CC synth-2D]
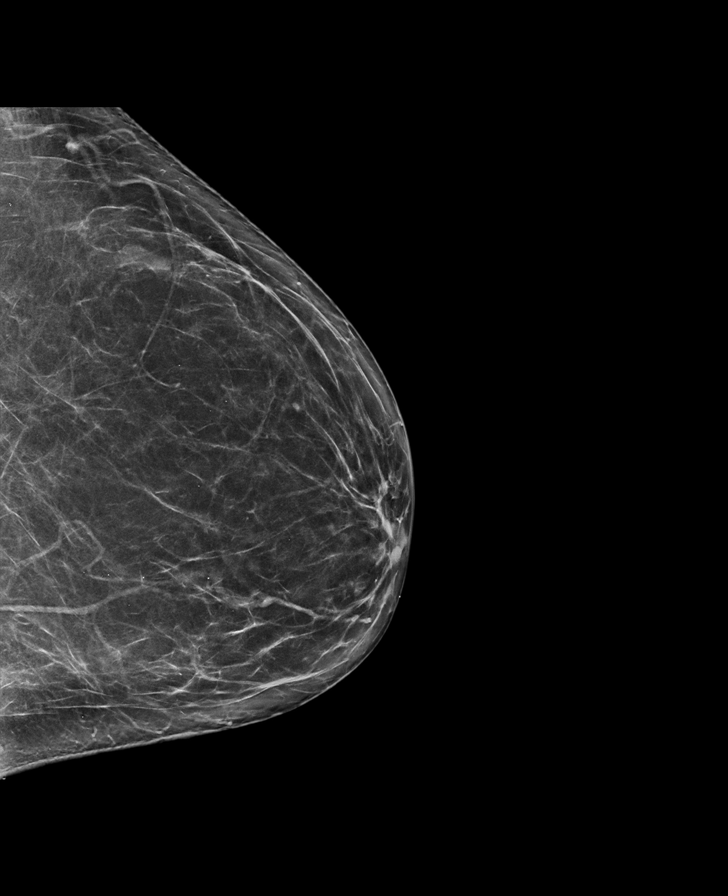

[R CC tomo · tomo slice 40/79.0]
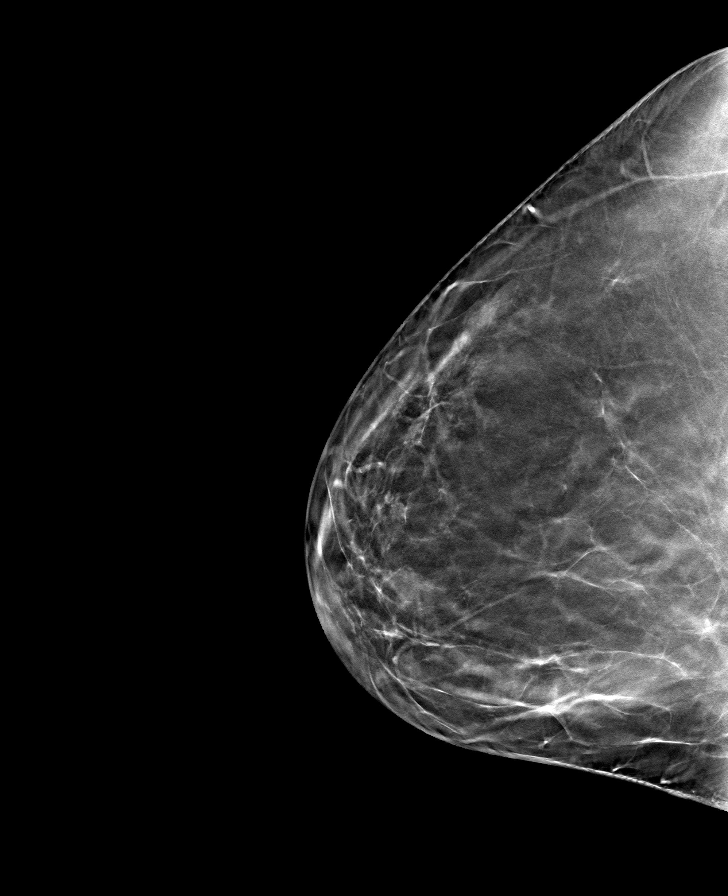

[L CC tomo · tomo slice 37/74.0]
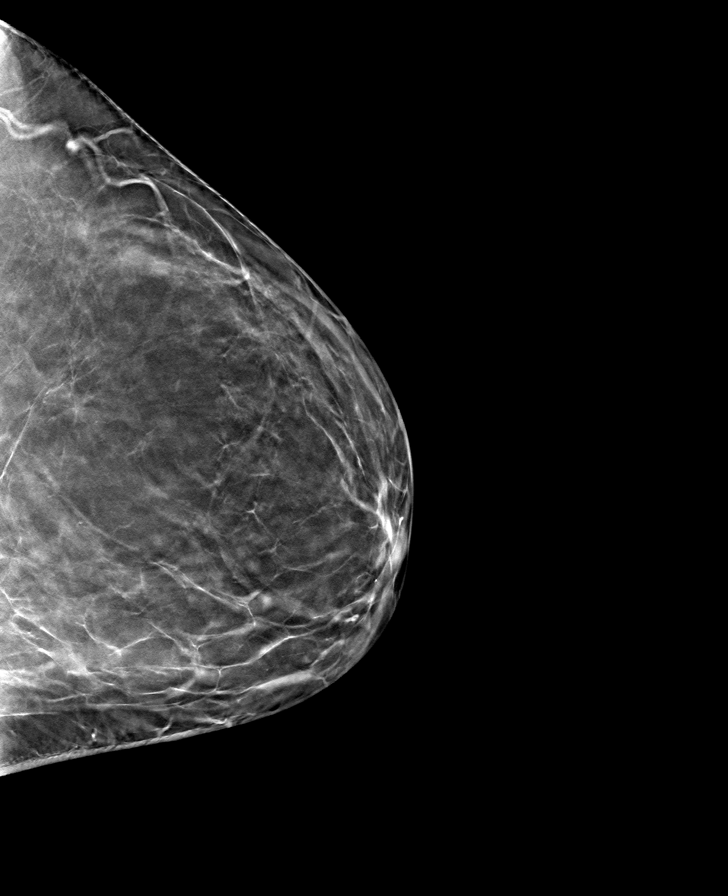

[L MLO tomo · tomo slice 41/82.0]
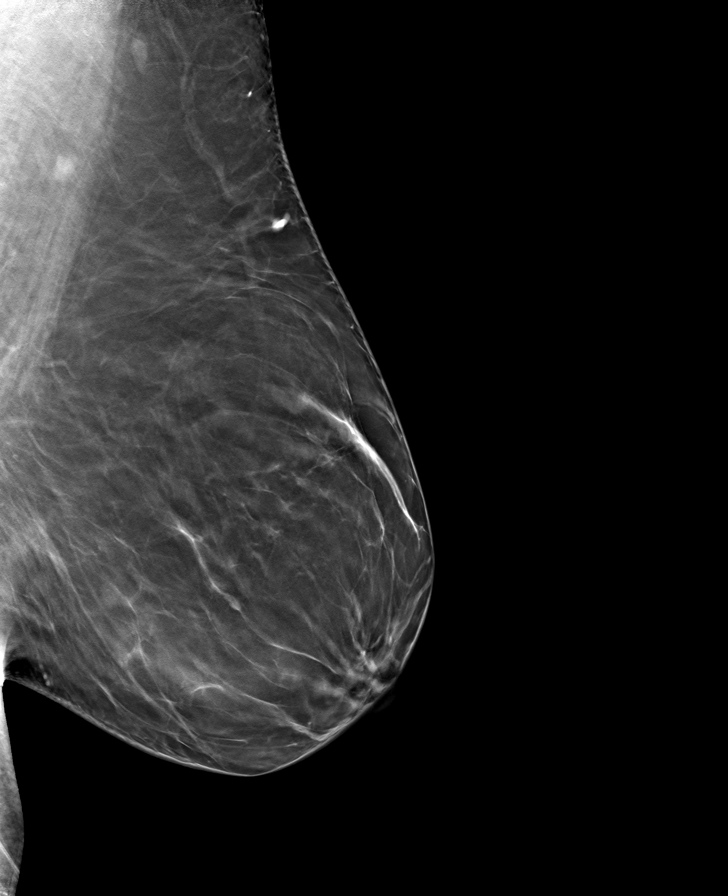

[R MLO tomo · tomo slice 41/82.0]
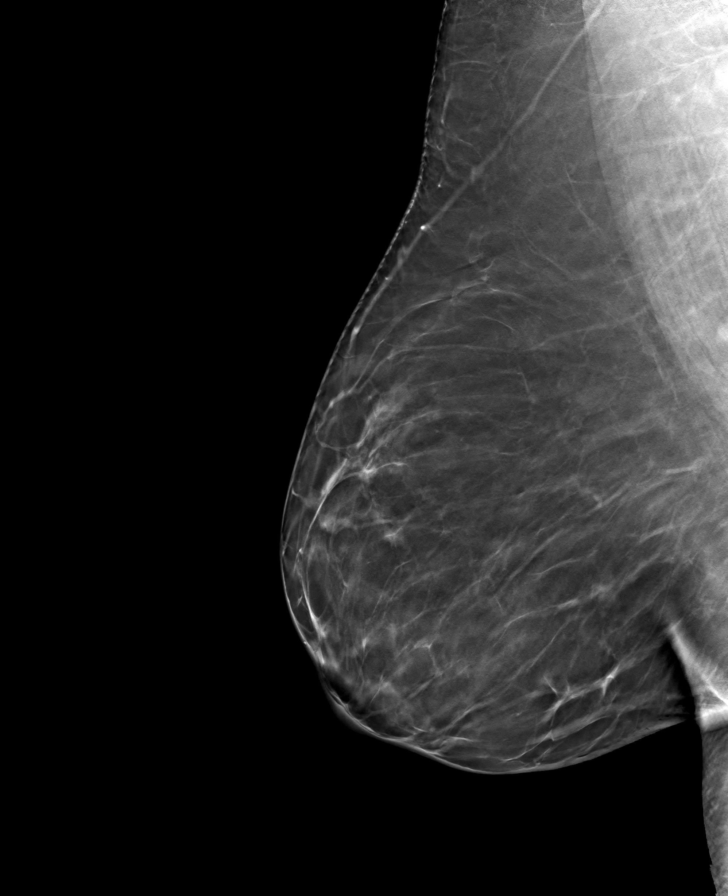

[8 of 24 positions shown; findings below may reference images not displayed]

ACR Breast Density Category b: There are scattered areas of
fibroglandular density.
FINDINGS: There are no findings suspicious for malignancy.
IMPRESSION: No mammographic evidence of malignancy. A result letter of this
screening mammogram will be mailed directly to the patient.

RECOMMENDATION:
Screening mammogram in one year. (Code:51-O-LD2)

BI-RADS CATEGORY  1: Negative.

## 2024-06-01 ENCOUNTER — Other Ambulatory Visit: Payer: Self-pay | Admitting: Family Medicine

## 2024-06-01 DIAGNOSIS — Z1231 Encounter for screening mammogram for malignant neoplasm of breast: Secondary | ICD-10-CM

## 2024-07-04 ENCOUNTER — Other Ambulatory Visit: Payer: Self-pay | Admitting: Medical Genetics

## 2024-07-07 ENCOUNTER — Ambulatory Visit
Admission: RE | Admit: 2024-07-07 | Discharge: 2024-07-07 | Disposition: A | Source: Ambulatory Visit | Attending: Family Medicine | Admitting: Family Medicine

## 2024-07-07 DIAGNOSIS — Z1231 Encounter for screening mammogram for malignant neoplasm of breast: Secondary | ICD-10-CM

## 2024-07-08 ENCOUNTER — Other Ambulatory Visit
Admission: RE | Admit: 2024-07-08 | Discharge: 2024-07-08 | Disposition: A | Discharge status: Home / Self Care | Payer: Self-pay | Source: Ambulatory Visit | Attending: Medical Genetics | Admitting: Medical Genetics

## 2024-07-25 LAB — GENECONNECT MOLECULAR SCREEN: Genetic Analysis Overall Interpretation: NEGATIVE
